# Patient Record
Sex: Female | Born: 1980 | Race: Black or African American | Hispanic: No | Marital: Single | Smoking: Former smoker
Health system: Southern US, Community
[De-identification: ages and names within clinical notes are randomized; demographics above are authoritative.]

## PROBLEM LIST (undated history)

## (undated) DIAGNOSIS — R011 Cardiac murmur, unspecified: Secondary | ICD-10-CM

## (undated) DIAGNOSIS — I10 Essential (primary) hypertension: Secondary | ICD-10-CM

## (undated) DIAGNOSIS — R7303 Prediabetes: Secondary | ICD-10-CM

## (undated) HISTORY — PX: WISDOM TOOTH EXTRACTION: SHX21

---

## 2010-04-15 ENCOUNTER — Emergency Department (HOSPITAL_BASED_OUTPATIENT_CLINIC_OR_DEPARTMENT_OTHER): Admission: EM | Admit: 2010-04-15 | Discharge: 2010-04-15 | Payer: Self-pay | Admitting: Emergency Medicine

## 2010-04-17 ENCOUNTER — Emergency Department (HOSPITAL_BASED_OUTPATIENT_CLINIC_OR_DEPARTMENT_OTHER): Admission: EM | Admit: 2010-04-17 | Discharge: 2010-04-17 | Payer: Self-pay | Admitting: Emergency Medicine

## 2011-12-24 ENCOUNTER — Ambulatory Visit: Payer: Self-pay | Admitting: Family Medicine

## 2014-04-11 ENCOUNTER — Other Ambulatory Visit: Payer: Self-pay | Admitting: Internal Medicine

## 2014-04-11 DIAGNOSIS — E041 Nontoxic single thyroid nodule: Secondary | ICD-10-CM

## 2014-04-20 ENCOUNTER — Ambulatory Visit
Admission: RE | Admit: 2014-04-20 | Discharge: 2014-04-20 | Disposition: A | Discharge status: Home / Self Care | Payer: 59 | Source: Ambulatory Visit | Attending: Internal Medicine | Admitting: Internal Medicine

## 2014-04-20 ENCOUNTER — Other Ambulatory Visit (HOSPITAL_COMMUNITY)
Admission: RE | Admit: 2014-04-20 | Discharge: 2014-04-20 | Disposition: A | Discharge status: Home / Self Care | Payer: 59 | Source: Ambulatory Visit | Attending: Interventional Radiology | Admitting: Interventional Radiology

## 2014-04-20 DIAGNOSIS — E041 Nontoxic single thyroid nodule: Secondary | ICD-10-CM

## 2014-11-01 ENCOUNTER — Other Ambulatory Visit: Payer: Self-pay | Admitting: Internal Medicine

## 2014-11-01 DIAGNOSIS — E042 Nontoxic multinodular goiter: Secondary | ICD-10-CM

## 2014-11-07 ENCOUNTER — Ambulatory Visit: Payer: 59

## 2015-07-19 IMAGING — US US THYROID BIOPSY
1 series · 9 of 9 positions shown · non-contrast
Comparison: 12/20/2013

CLINICAL DATA: Multinodular thyroid gland, goiter

EXAM:
ULTRASOUND GUIDED NEEDLE ASPIRATE BIOPSY OF THE THYROID GLAND

[Series 1: us thyroid biopsy · 9 acquisitions, 9 frames shown]
[im 1/9]
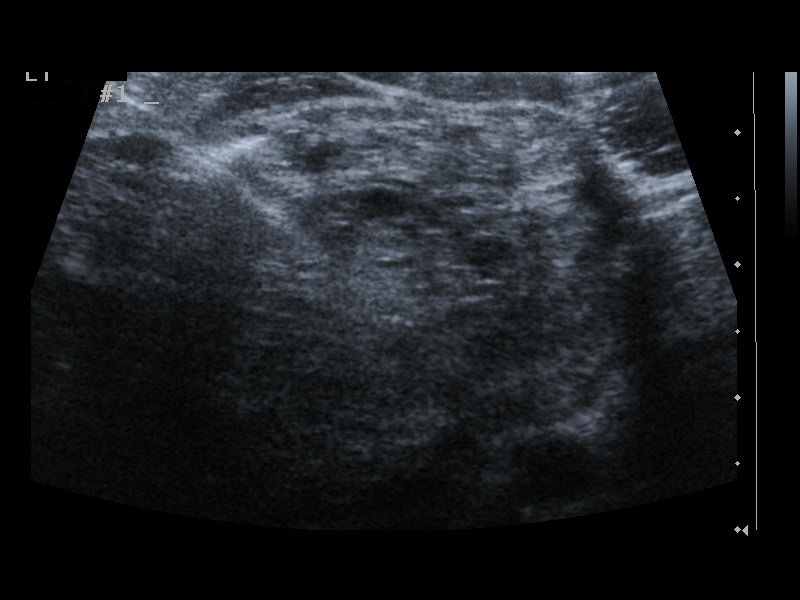
[im 2/9]
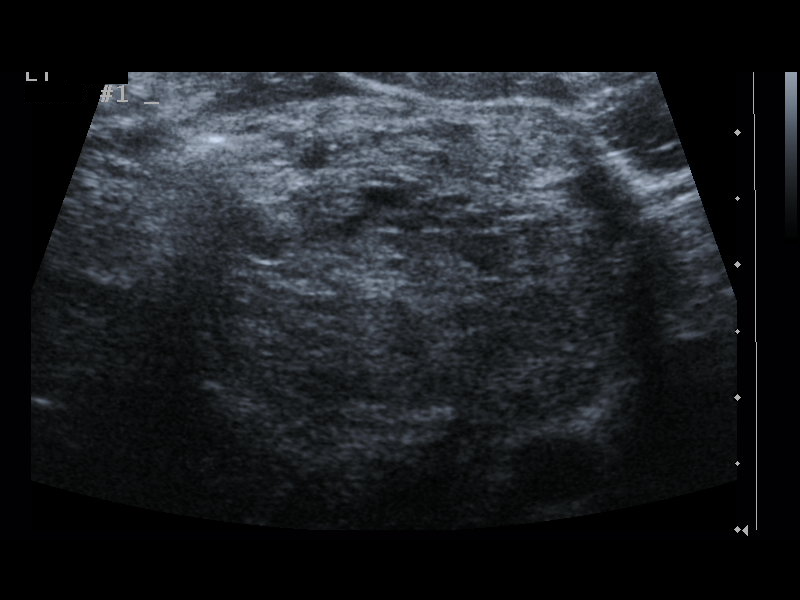
[im 3/9]
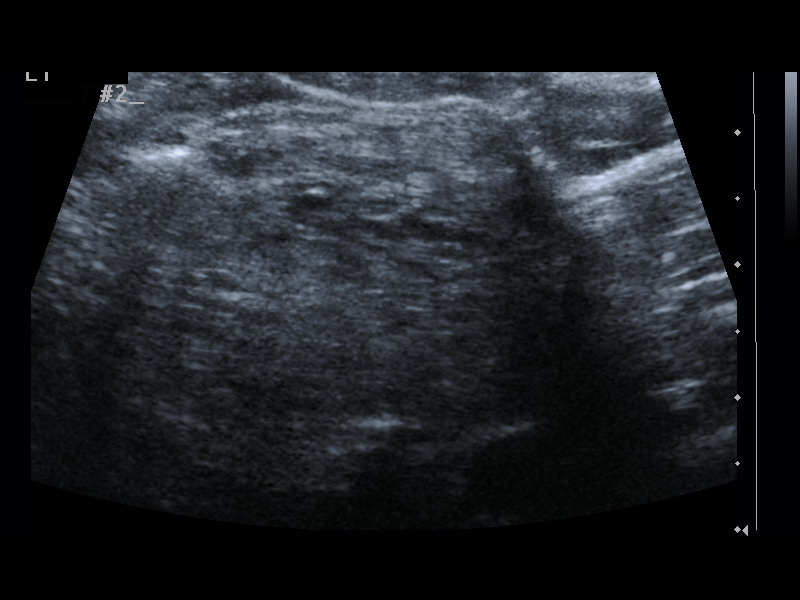
[im 4/9]
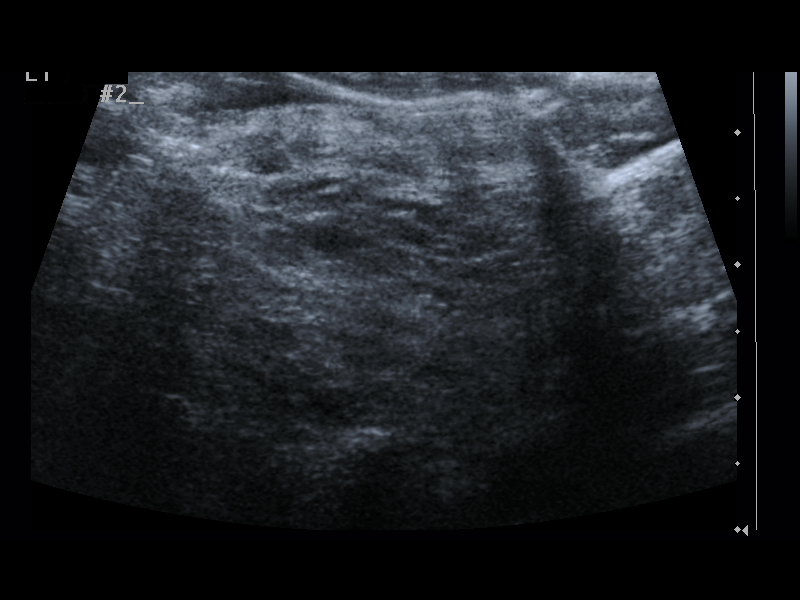
[im 5/9]
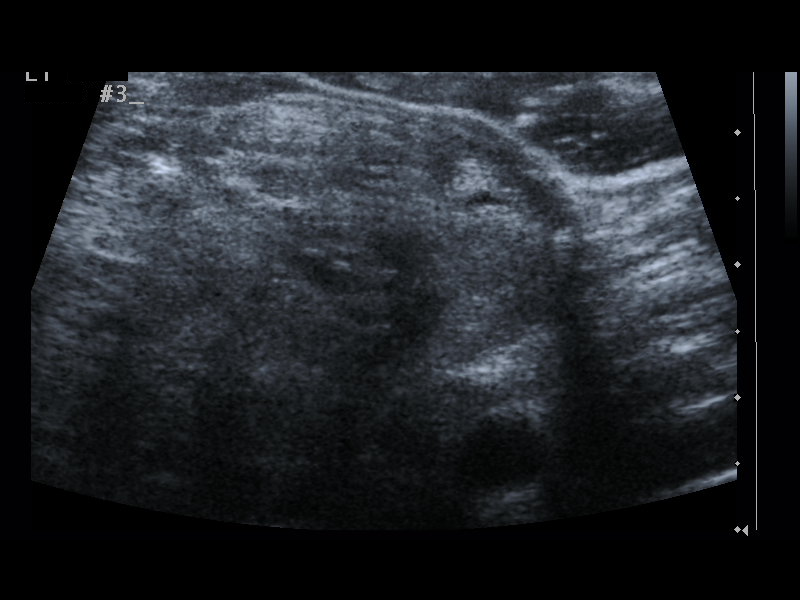
[im 6/9]
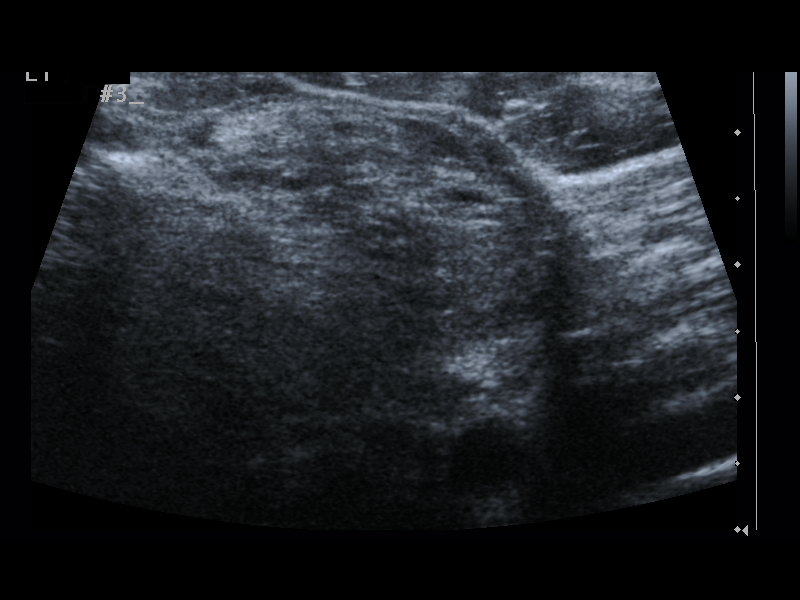
[im 7/9]
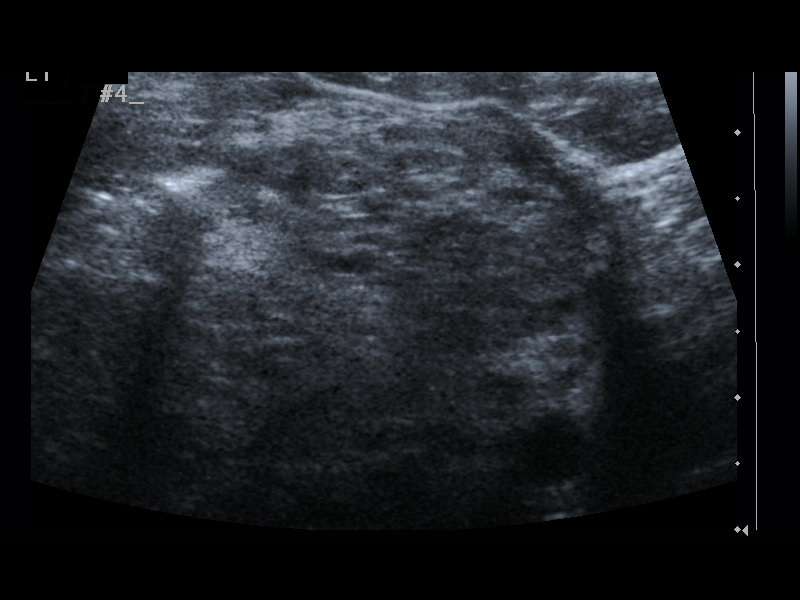
[im 8/9]
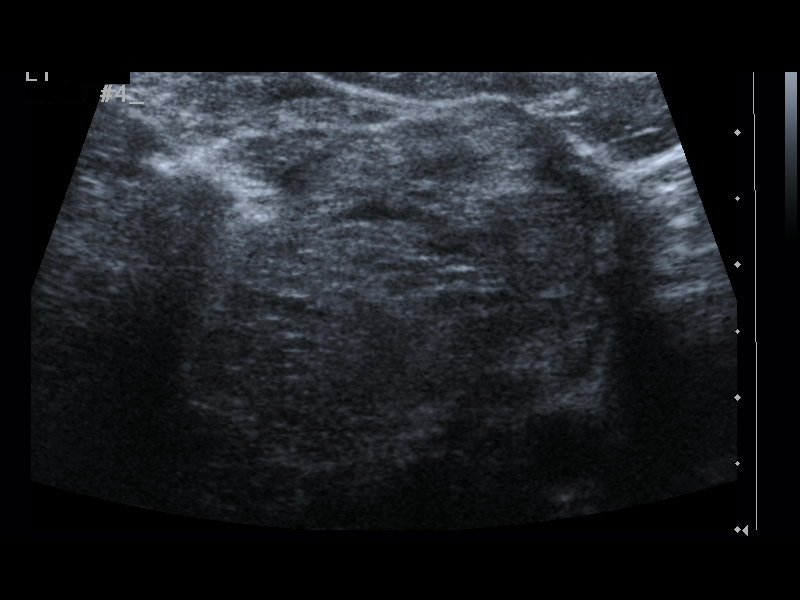
[im 9/9]
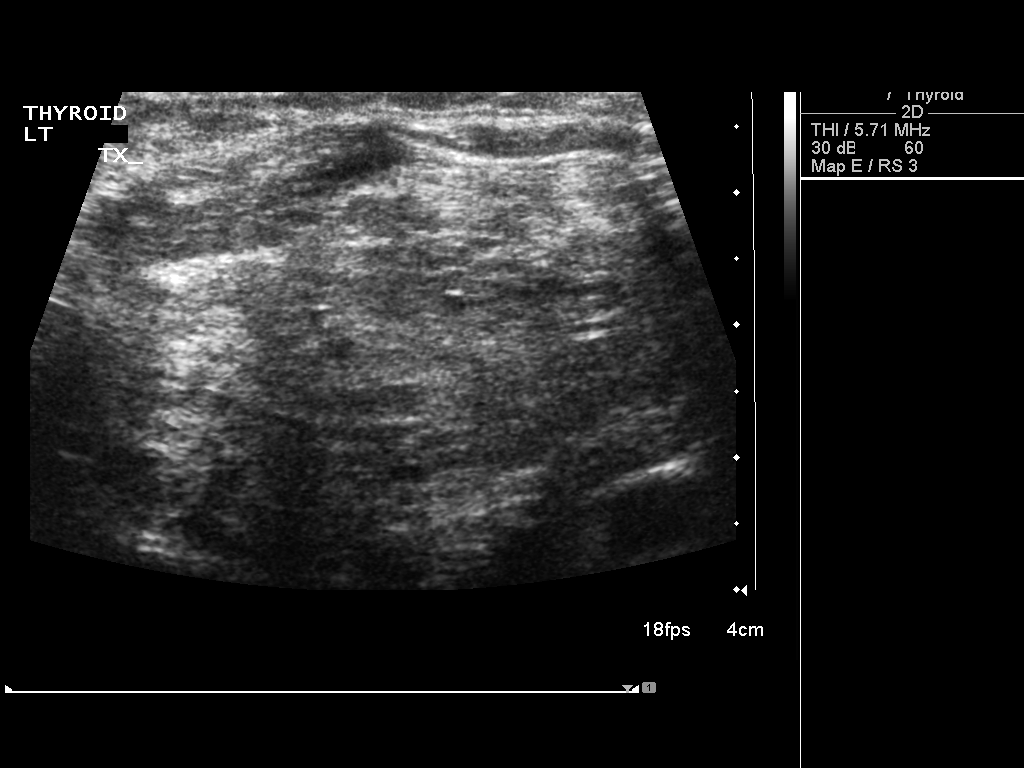

[9 of 9 positions shown; findings below may reference images not displayed]

PROCEDURE:
Thyroid biopsy was thoroughly discussed with the patient and
questions were answered. The benefits, risks, alternatives, and
complications were also discussed. The patient understands and
wishes to proceed with the procedure. Written consent was obtained.



Complications:  The median
FINDINGS: Imaging confirms needle placed in the dominant left inferior thyroid
mass
IMPRESSION: Ultrasound guided needle aspirate biopsy performed of the dominant
left inferior thyroid mass.

Other dominant right inferior and isthmus nodules are less apparent
on today's study. Cystic components have resolved. Recommend
six-month follow-up of the other thyroid nodules.

## 2015-10-15 DIAGNOSIS — E049 Nontoxic goiter, unspecified: Secondary | ICD-10-CM | POA: Insufficient documentation

## 2015-10-16 ENCOUNTER — Ambulatory Visit
Admission: RE | Admit: 2015-10-16 | Discharge: 2015-10-16 | Disposition: A | Discharge status: Home / Self Care | Payer: 59 | Source: Ambulatory Visit | Attending: Internal Medicine | Admitting: Internal Medicine

## 2015-10-16 DIAGNOSIS — E042 Nontoxic multinodular goiter: Secondary | ICD-10-CM

## 2015-10-22 DIAGNOSIS — E119 Type 2 diabetes mellitus without complications: Secondary | ICD-10-CM | POA: Insufficient documentation

## 2015-11-15 DIAGNOSIS — E042 Nontoxic multinodular goiter: Secondary | ICD-10-CM | POA: Diagnosis not present

## 2015-11-15 MED FILL — ACZONE 5% GEL: 5 | 30 days supply | Qty: 60 | Fill #0

## 2015-11-16 MED FILL — metFORMIN HCL 500 MG TABS: 500 | 30 days supply | Qty: 60 | Fill #1

## 2015-11-29 DIAGNOSIS — E119 Type 2 diabetes mellitus without complications: Secondary | ICD-10-CM | POA: Diagnosis not present

## 2015-12-12 DIAGNOSIS — L700 Acne vulgaris: Secondary | ICD-10-CM | POA: Diagnosis not present

## 2015-12-12 MED FILL — clonazePAM 0.5 MG TABS: 0.5 | 10 days supply | Qty: 20 | Fill #0

## 2015-12-12 MED FILL — DOXYCYCLINE HYCLATE 100 MG: 100 | 30 days supply | Qty: 30 | Fill #0

## 2015-12-18 MED FILL — ACZONE 7.5% GEL PUMP: 7.5 | 90 days supply | Qty: 90 | Fill #0

## 2015-12-18 MED FILL — TRETINOIN 0.05% CREAM: 0.05 | 30 days supply | Qty: 45 | Fill #0

## 2015-12-18 MED FILL — metFORMIN HCL 500 MG TABS: 500 | 30 days supply | Qty: 60 | Fill #2

## 2016-01-08 MED FILL — DOXYCYCLINE HYCLATE 100 MG: 100 | 30 days supply | Qty: 30 | Fill #1

## 2016-01-08 MED FILL — LISINOPRIL-HCTZ 10-12.5 MG: 10-12.5 | 90 days supply | Qty: 90 | Fill #1

## 2016-01-16 DIAGNOSIS — E119 Type 2 diabetes mellitus without complications: Secondary | ICD-10-CM | POA: Diagnosis not present

## 2016-01-16 MED FILL — metFORMIN HCL 500 MG TABS: 500 | 30 days supply | Qty: 60 | Fill #3

## 2016-01-22 DIAGNOSIS — E119 Type 2 diabetes mellitus without complications: Secondary | ICD-10-CM | POA: Diagnosis not present

## 2016-01-22 DIAGNOSIS — H5213 Myopia, bilateral: Secondary | ICD-10-CM | POA: Diagnosis not present

## 2016-01-22 DIAGNOSIS — Z7984 Long term (current) use of oral hypoglycemic drugs: Secondary | ICD-10-CM | POA: Diagnosis not present

## 2016-01-22 DIAGNOSIS — H52223 Regular astigmatism, bilateral: Secondary | ICD-10-CM | POA: Diagnosis not present

## 2016-02-02 DIAGNOSIS — E538 Deficiency of other specified B group vitamins: Secondary | ICD-10-CM | POA: Diagnosis not present

## 2016-02-02 DIAGNOSIS — E0500 Thyrotoxicosis with diffuse goiter without thyrotoxic crisis or storm: Secondary | ICD-10-CM | POA: Diagnosis not present

## 2016-02-02 DIAGNOSIS — R5382 Chronic fatigue, unspecified: Secondary | ICD-10-CM | POA: Diagnosis not present

## 2016-02-02 DIAGNOSIS — E559 Vitamin D deficiency, unspecified: Secondary | ICD-10-CM | POA: Diagnosis not present

## 2016-02-02 DIAGNOSIS — E039 Hypothyroidism, unspecified: Secondary | ICD-10-CM | POA: Diagnosis not present

## 2016-02-06 MED FILL — DOXYCYCLINE HYCLATE 100 MG: 100 | 30 days supply | Qty: 30 | Fill #2

## 2016-02-18 MED FILL — metFORMIN HCL 500 MG TABS: 500 | 30 days supply | Qty: 60 | Fill #4

## 2016-03-19 MED FILL — metFORMIN HCL 500 MG TABS: 500 | 30 days supply | Qty: 60 | Fill #5

## 2016-04-02 DIAGNOSIS — L700 Acne vulgaris: Secondary | ICD-10-CM | POA: Diagnosis not present

## 2016-04-02 DIAGNOSIS — L810 Postinflammatory hyperpigmentation: Secondary | ICD-10-CM | POA: Diagnosis not present

## 2016-04-02 MED FILL — LISINOPRIL-HCTZ 10-12.5 MG: 10-12.5 | 90 days supply | Qty: 90 | Fill #2

## 2016-04-02 MED FILL — DOXYCYCLINE HYCLATE 100 MG: 100 | 30 days supply | Qty: 30 | Fill #0

## 2016-04-17 DIAGNOSIS — E119 Type 2 diabetes mellitus without complications: Secondary | ICD-10-CM | POA: Diagnosis not present

## 2016-05-06 MED FILL — TRETINOIN 0.05% CREAM: 0.05 | 30 days supply | Qty: 45 | Fill #1

## 2016-05-06 MED FILL — ACZONE 7.5% GEL PUMP: 7.5 | 90 days supply | Qty: 90 | Fill #1

## 2016-07-03 DIAGNOSIS — E079 Disorder of thyroid, unspecified: Secondary | ICD-10-CM | POA: Diagnosis not present

## 2016-07-03 DIAGNOSIS — E785 Hyperlipidemia, unspecified: Secondary | ICD-10-CM | POA: Diagnosis not present

## 2016-07-03 DIAGNOSIS — I10 Essential (primary) hypertension: Secondary | ICD-10-CM | POA: Diagnosis not present

## 2016-07-03 DIAGNOSIS — Z8639 Personal history of other endocrine, nutritional and metabolic disease: Secondary | ICD-10-CM | POA: Diagnosis not present

## 2016-07-29 DIAGNOSIS — I10 Essential (primary) hypertension: Secondary | ICD-10-CM | POA: Diagnosis not present

## 2016-07-29 DIAGNOSIS — E119 Type 2 diabetes mellitus without complications: Secondary | ICD-10-CM | POA: Diagnosis not present

## 2016-07-29 DIAGNOSIS — L309 Dermatitis, unspecified: Secondary | ICD-10-CM | POA: Diagnosis not present

## 2016-09-27 MED FILL — ELIDEL 1% CREAM: 1 | 10 days supply | Qty: 30 | Fill #0

## 2016-10-25 ENCOUNTER — Other Ambulatory Visit: Payer: Self-pay | Admitting: Internal Medicine

## 2016-10-25 DIAGNOSIS — E042 Nontoxic multinodular goiter: Secondary | ICD-10-CM

## 2016-10-30 ENCOUNTER — Other Ambulatory Visit: Payer: 59

## 2016-11-12 ENCOUNTER — Ambulatory Visit
Admission: RE | Admit: 2016-11-12 | Discharge: 2016-11-12 | Disposition: A | Discharge status: Home / Self Care | Payer: 59 | Source: Ambulatory Visit | Attending: Internal Medicine | Admitting: Internal Medicine

## 2016-11-12 DIAGNOSIS — E042 Nontoxic multinodular goiter: Secondary | ICD-10-CM | POA: Diagnosis not present

## 2016-11-19 DIAGNOSIS — E042 Nontoxic multinodular goiter: Secondary | ICD-10-CM | POA: Diagnosis not present

## 2016-12-04 DIAGNOSIS — E042 Nontoxic multinodular goiter: Secondary | ICD-10-CM | POA: Diagnosis not present

## 2016-12-04 DIAGNOSIS — N920 Excessive and frequent menstruation with regular cycle: Secondary | ICD-10-CM | POA: Diagnosis not present

## 2016-12-04 DIAGNOSIS — E559 Vitamin D deficiency, unspecified: Secondary | ICD-10-CM | POA: Diagnosis not present

## 2016-12-17 MED FILL — ACZONE 7.5% GEL PUMP: 7.5 | 90 days supply | Qty: 90 | Fill #0

## 2017-01-13 IMAGING — US US SOFT TISSUE HEAD/NECK
1 series · 13 of 25 positions shown · non-contrast
Comparison: Thyroid ultrasound - 12/20/2013; left-sided thyroid
nodule ultrasound-guided fine-needle aspiration - 04/20/2014

CLINICAL DATA: History of multi nodular goiter.

EXAM:
THYROID ULTRASOUND
TECHNIQUE: Ultrasound examination of the thyroid gland and adjacent soft
tissues was performed.

[Series 1: us soft tissue head/neck · 0.07mm/px · 13 of 69 slices shown]
[im 1/69]
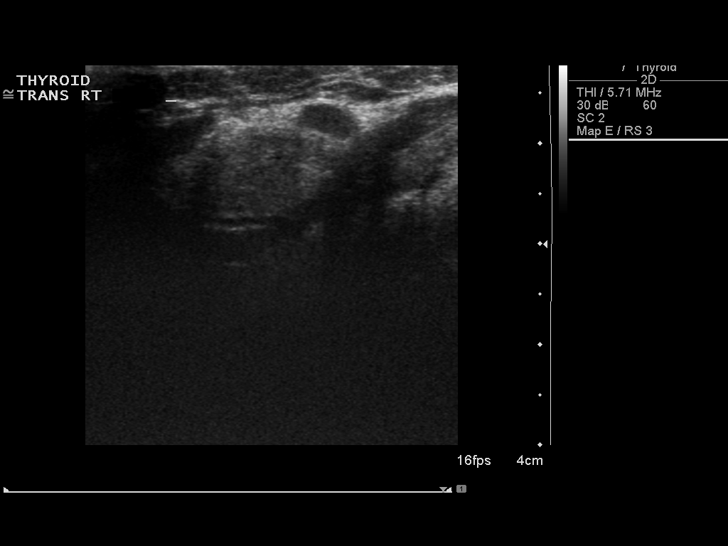
[im 6/69]
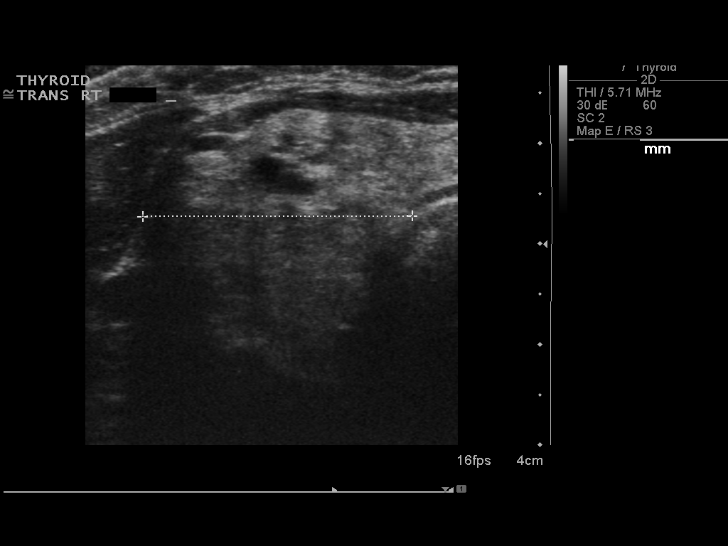
[im 12/69]
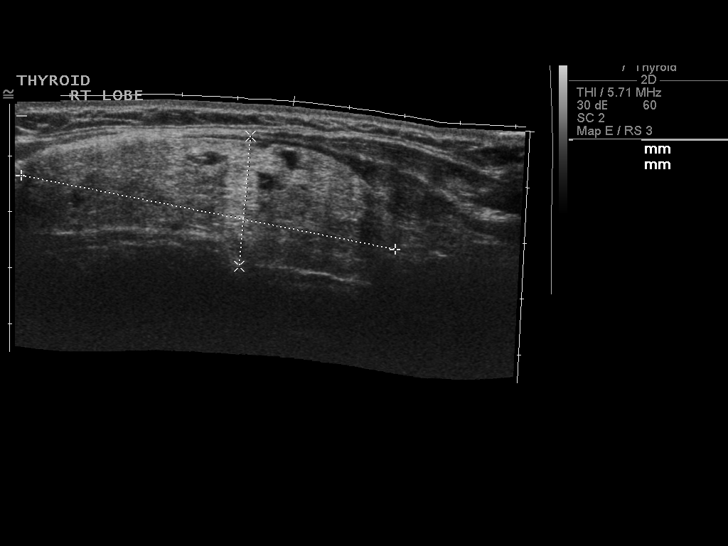
[im 18/69]
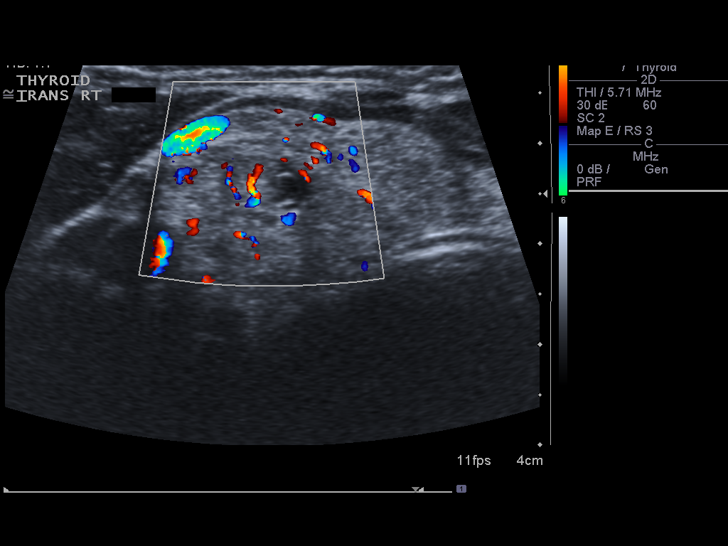
[im 23/69]
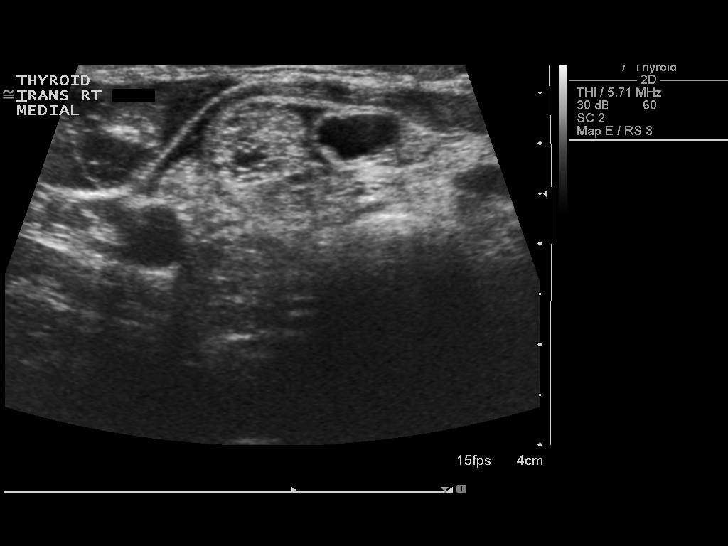
[im 29/69]
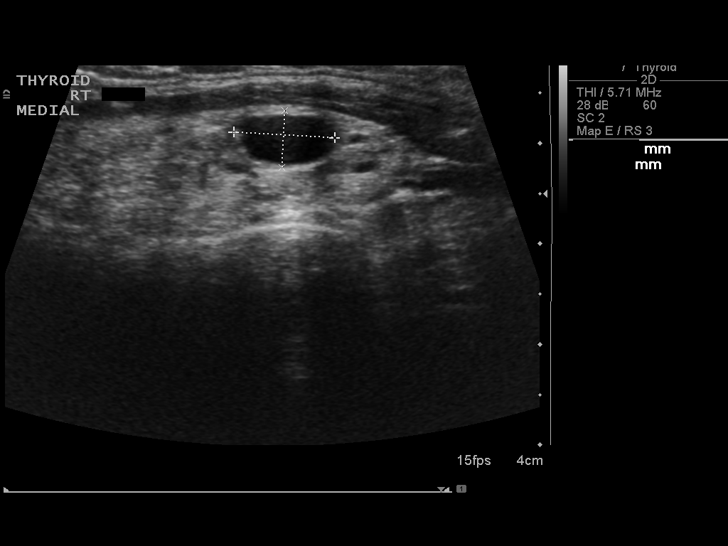
[im 35/69]
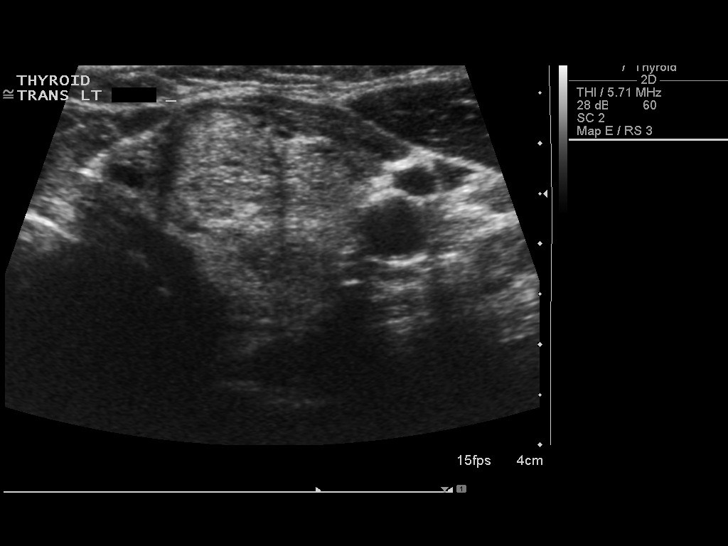
[im 40/69]
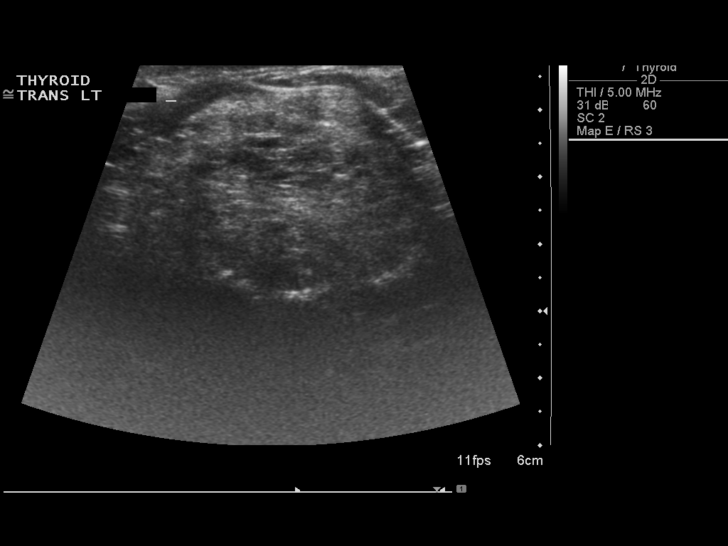
[im 46/69]
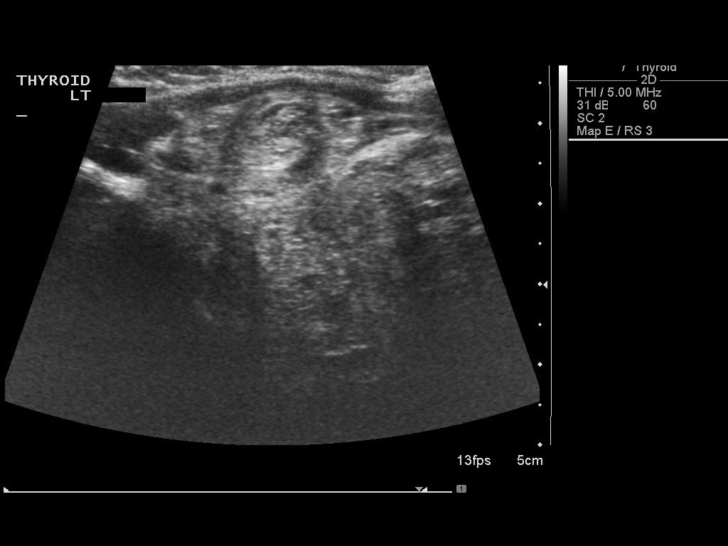
[im 52/69]
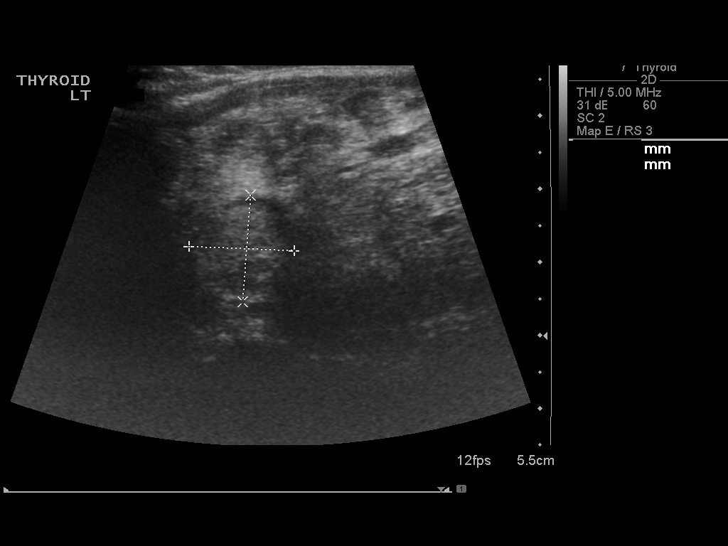
[im 57/69]
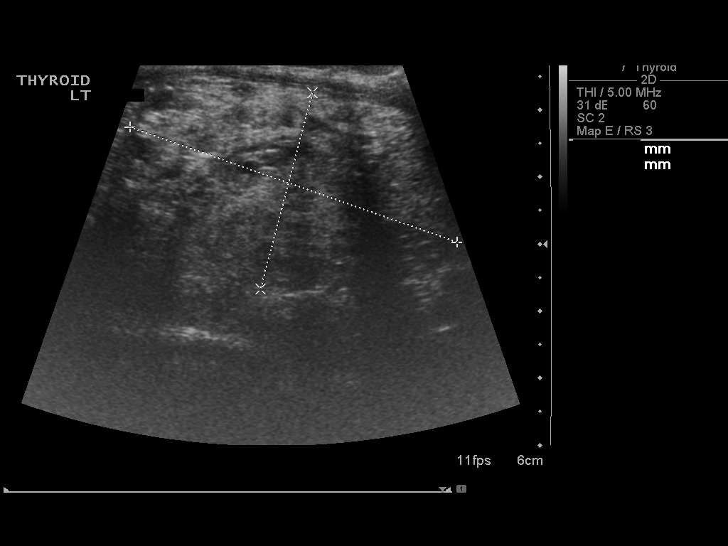
[im 63/69]
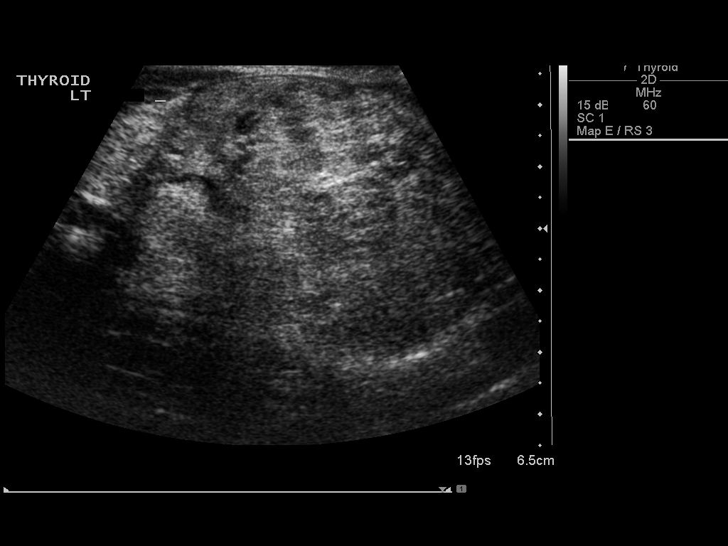
[im 69/69]
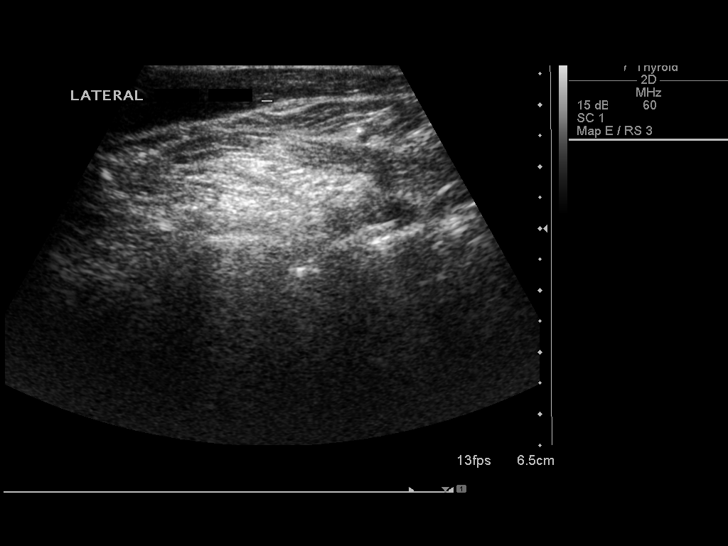

[13 of 25 positions shown; findings below may reference images not displayed]

FINDINGS: There is diffuse heterogeneity of thyroid parenchymal echotexture

Right thyroid lobe

Measurements: Borderline enlarged measuring 6.8 x 2.3 x 2.7 cm,
grossly unchanged, previously, 6.1 x 2.1 x 2.2 cm.

Right, mid - 1.0 x 0.6 x 0.8 cm - mixed echogenic, partially cystic,
partially solid - grossly unchanged, previously, 1.0 cm in maximal
diameter.

Right, inferior, anterior - 1.3 x 0.7 x 1.1 cm - mixed echogenic,
partially cystic, partially solid - similar in hind site,
previously, 1.1 x 0.8 cm

Right, inferior- 1.4 x 1.0 x 1.2 cm - mixed echogenic, partially
cystic, partially solid - decreased in size in the interval,
previously, 1.7 x 1.0 x 1.7 cm

Right, medial - 1.0 x 0.6 x 1.1 cm - anechoic, cystic - unchanged,
previously, 1.0 x 1.2 x 0.7 cm

Left thyroid lobe

Measurements: Enlarged measuring 8.0 x 3.5 x 4.5 cm, previously,
x 3.3 x 4.6 cm.

Left, superior - 1.9 x 1.3 x 1.1 cm - mixed echogenic, solid -
grossly unchanged in size, previously, 1.9 x 1.3 cm in diameter
though now with more solid component.

Left, inferior, medial - 1.4 x 1.5 x 1.5 cm - mixed echogenic, solid
- previously 1.2 x 1.0 cm with slight differences likely
attributable to scan plane projection.

Left, inferior - 5.2 x 3.0 x 4.3 cm - mixed echogenic, partially
cystic, predominately solid - grossly unchanged in hind site
measuring approximately 5.6 x 3.0 cm - this nodule was previously
biopsied.

The previously biopsied approximately 2.9 cm complex nodule within
the left lobe of the thyroid is not discretely measured on the
present examination rather is favored to be a portion of the
dominant nodule/mass replacing near the entirety the inferior aspect
the left lobe of the thyroid.

Isthmus

Thickness: Enlarged measuring 0.8 cm in diameter, previously,
cm..

No discrete nodule or mass identified within the thyroid isthmus.

Lymphadenopathy

None visualized.
IMPRESSION: 1. Overall similar findings suggestive of multi nodular goiter. No
definitive new or enlarging thyroid nodules.
2. The previously biopsied approximately 2.9 cm complex nodule
within the left lobe of the thyroid is not discretely measured on
the present examination, rather is favored to be a portion of the
dominant nodule/mass replacing near the entirety the inferior aspect
the left lobe of the thyroid which appears grossly unchanged in hind
site since the [DATE] examination. Regardless, correlation with
prior biopsy results is recommended.

## 2017-01-15 DIAGNOSIS — Z569 Unspecified problems related to employment: Secondary | ICD-10-CM | POA: Diagnosis not present

## 2017-01-15 DIAGNOSIS — Z600 Problems of adjustment to life-cycle transitions: Secondary | ICD-10-CM | POA: Diagnosis not present

## 2017-01-15 DIAGNOSIS — F4323 Adjustment disorder with mixed anxiety and depressed mood: Secondary | ICD-10-CM | POA: Diagnosis not present

## 2017-03-14 MED FILL — ACZONE 7.5% GEL PUMP: 7.5 | 90 days supply | Qty: 90 | Fill #1

## 2017-04-04 DIAGNOSIS — Z1329 Encounter for screening for other suspected endocrine disorder: Secondary | ICD-10-CM | POA: Diagnosis not present

## 2017-04-04 DIAGNOSIS — E119 Type 2 diabetes mellitus without complications: Secondary | ICD-10-CM | POA: Diagnosis not present

## 2017-04-04 DIAGNOSIS — E782 Mixed hyperlipidemia: Secondary | ICD-10-CM | POA: Diagnosis not present

## 2017-04-04 DIAGNOSIS — Z6834 Body mass index (BMI) 34.0-34.9, adult: Secondary | ICD-10-CM | POA: Diagnosis not present

## 2017-04-04 DIAGNOSIS — I10 Essential (primary) hypertension: Secondary | ICD-10-CM | POA: Diagnosis not present

## 2017-05-01 DIAGNOSIS — Z569 Unspecified problems related to employment: Secondary | ICD-10-CM | POA: Diagnosis not present

## 2017-05-01 DIAGNOSIS — F321 Major depressive disorder, single episode, moderate: Secondary | ICD-10-CM | POA: Diagnosis not present

## 2017-05-01 DIAGNOSIS — Z600 Problems of adjustment to life-cycle transitions: Secondary | ICD-10-CM | POA: Diagnosis not present

## 2017-05-28 DIAGNOSIS — E559 Vitamin D deficiency, unspecified: Secondary | ICD-10-CM | POA: Diagnosis not present

## 2017-05-28 DIAGNOSIS — E042 Nontoxic multinodular goiter: Secondary | ICD-10-CM | POA: Diagnosis not present

## 2017-05-28 DIAGNOSIS — R5383 Other fatigue: Secondary | ICD-10-CM | POA: Diagnosis not present

## 2017-06-03 DIAGNOSIS — F321 Major depressive disorder, single episode, moderate: Secondary | ICD-10-CM | POA: Diagnosis not present

## 2017-06-03 DIAGNOSIS — Z569 Unspecified problems related to employment: Secondary | ICD-10-CM | POA: Diagnosis not present

## 2017-06-03 DIAGNOSIS — Z600 Problems of adjustment to life-cycle transitions: Secondary | ICD-10-CM | POA: Diagnosis not present

## 2017-10-27 ENCOUNTER — Other Ambulatory Visit: Payer: Self-pay | Admitting: Internal Medicine

## 2017-10-27 DIAGNOSIS — E042 Nontoxic multinodular goiter: Secondary | ICD-10-CM

## 2017-10-31 ENCOUNTER — Other Ambulatory Visit: Payer: 59

## 2017-11-21 ENCOUNTER — Ambulatory Visit
Admission: RE | Admit: 2017-11-21 | Discharge: 2017-11-21 | Disposition: A | Discharge status: Home / Self Care | Payer: 59 | Source: Ambulatory Visit | Attending: Internal Medicine | Admitting: Internal Medicine

## 2017-11-21 DIAGNOSIS — E042 Nontoxic multinodular goiter: Secondary | ICD-10-CM

## 2017-11-25 DIAGNOSIS — E042 Nontoxic multinodular goiter: Secondary | ICD-10-CM | POA: Diagnosis not present

## 2017-11-25 DIAGNOSIS — R7989 Other specified abnormal findings of blood chemistry: Secondary | ICD-10-CM | POA: Diagnosis not present

## 2017-11-26 DIAGNOSIS — E042 Nontoxic multinodular goiter: Secondary | ICD-10-CM | POA: Diagnosis not present

## 2017-11-26 DIAGNOSIS — R5383 Other fatigue: Secondary | ICD-10-CM | POA: Diagnosis not present

## 2017-11-26 DIAGNOSIS — E559 Vitamin D deficiency, unspecified: Secondary | ICD-10-CM | POA: Diagnosis not present

## 2017-11-26 MED FILL — NEXPLANON 68 MG IMPLANT: 68 | 90 days supply | Qty: 1 | Fill #0

## 2017-12-01 MED FILL — ACZONE 7.5% GEL PUMP: 7.5 | 90 days supply | Qty: 90 | Fill #2

## 2017-12-03 DIAGNOSIS — Z3046 Encounter for surveillance of implantable subdermal contraceptive: Secondary | ICD-10-CM | POA: Diagnosis not present

## 2017-12-12 MED FILL — DOXYCYCLINE HYCLATE 100 MG: 100 | 30 days supply | Qty: 30 | Fill #0

## 2018-01-16 DIAGNOSIS — L700 Acne vulgaris: Secondary | ICD-10-CM | POA: Diagnosis not present

## 2018-01-16 MED FILL — DOXYCYCLINE HYCLATE 100 MG: 100 | 30 days supply | Qty: 30 | Fill #0

## 2018-01-16 MED FILL — CLINDAMYCIN PHOS-BENZOYL PE: 1-5 | 30 days supply | Qty: 50 | Fill #0

## 2018-01-20 MED FILL — TRETINOIN 0.1% CREAM: 0.1 | 30 days supply | Qty: 45 | Fill #0

## 2018-04-03 DIAGNOSIS — H40013 Open angle with borderline findings, low risk, bilateral: Secondary | ICD-10-CM | POA: Diagnosis not present

## 2018-04-03 DIAGNOSIS — H52223 Regular astigmatism, bilateral: Secondary | ICD-10-CM | POA: Diagnosis not present

## 2018-04-03 DIAGNOSIS — H5213 Myopia, bilateral: Secondary | ICD-10-CM | POA: Diagnosis not present

## 2018-04-03 DIAGNOSIS — Z83511 Family history of glaucoma: Secondary | ICD-10-CM | POA: Diagnosis not present

## 2018-05-22 IMAGING — US US THYROID
1 series · 12 of 25 positions shown · non-contrast
Comparison: 11/12/2016;

CLINICAL DATA: Goiter. History of left-sided thyroid nodule
fine-needle aspiration from 04/20/2014

EXAM:
THYROID ULTRASOUND
TECHNIQUE: Ultrasound examination of the thyroid gland and adjacent soft
tissues was performed.

[Series 1: us thyroid · 0.06mm/px · 12 of 58 slices shown]
[im 3/58]
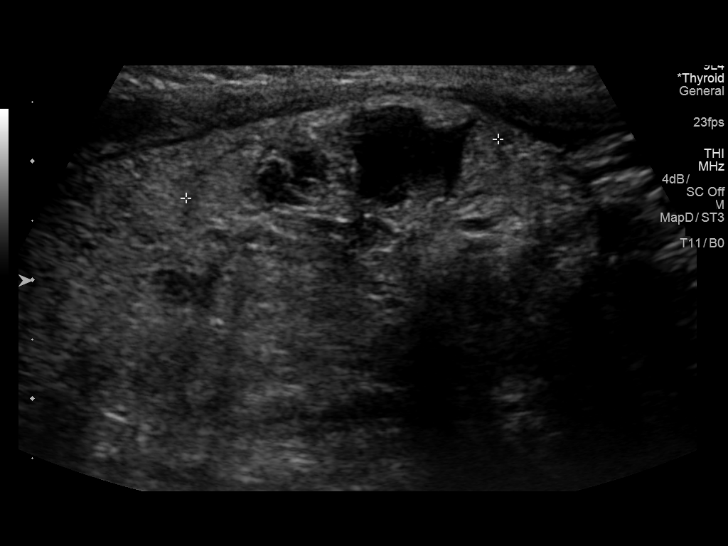
[im 8/58]
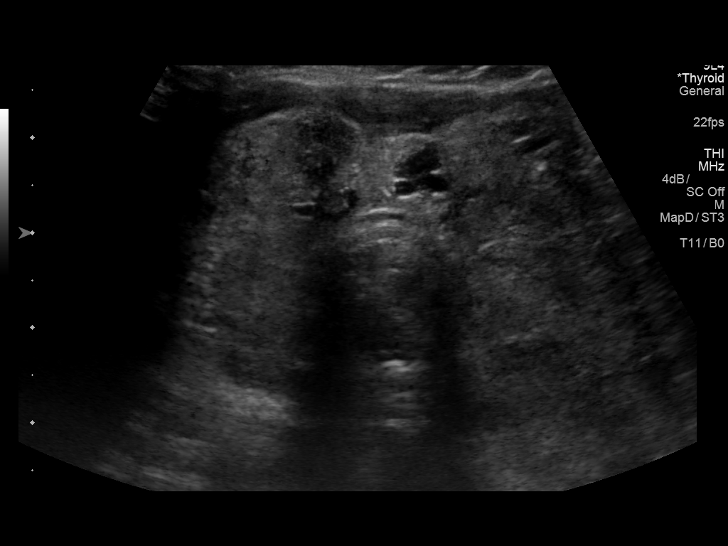
[im 12/58]
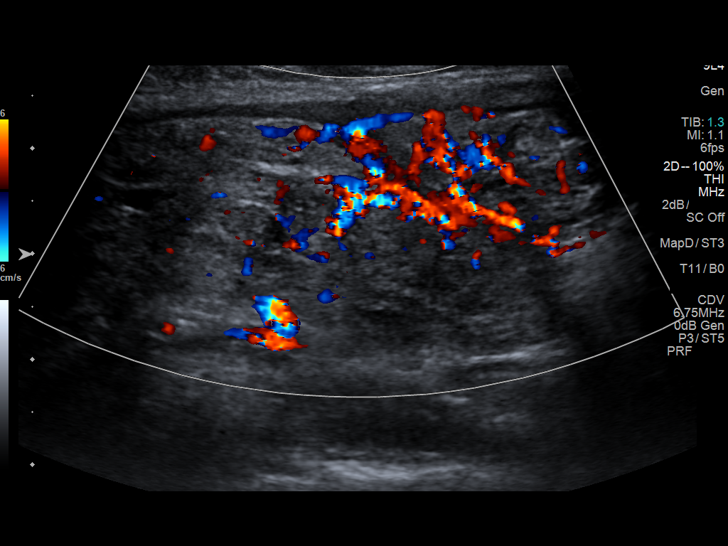
[im 17/58]
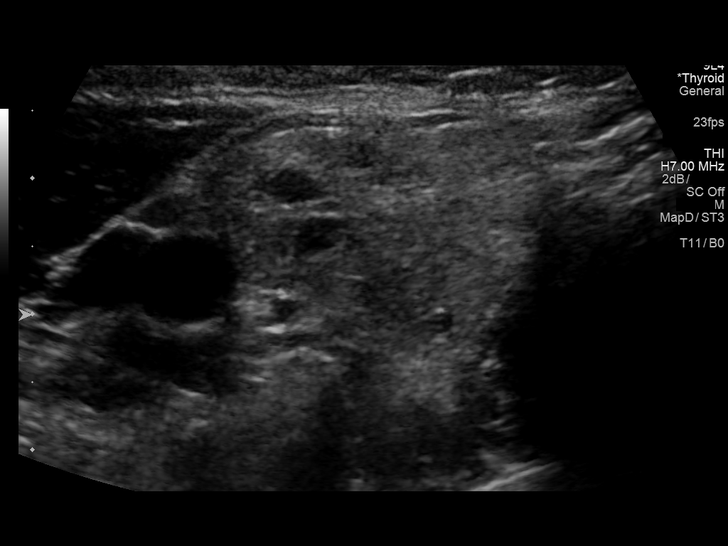
[im 22/58]
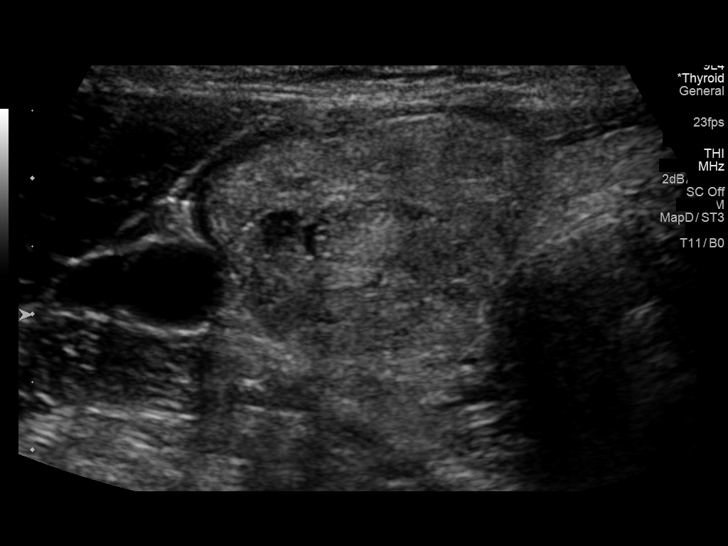
[im 27/58]
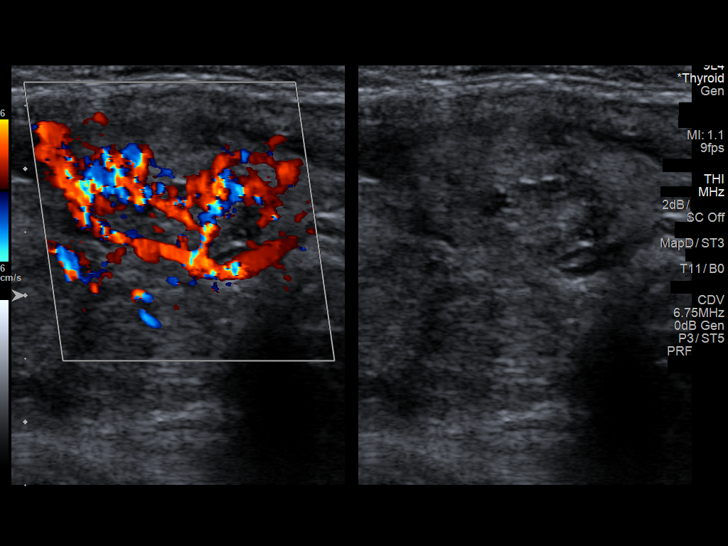
[im 31/58]
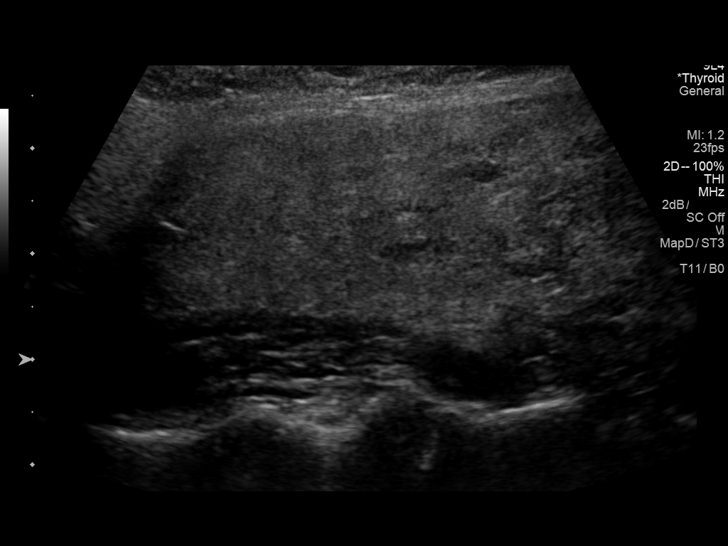
[im 36/58]
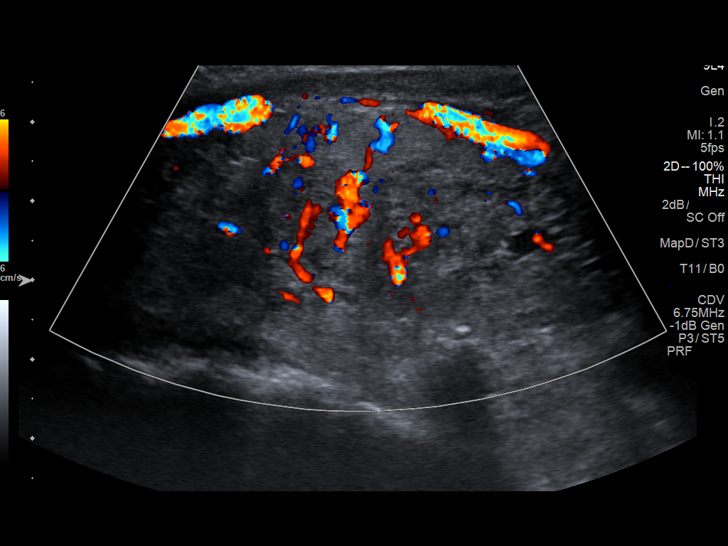
[im 41/58]
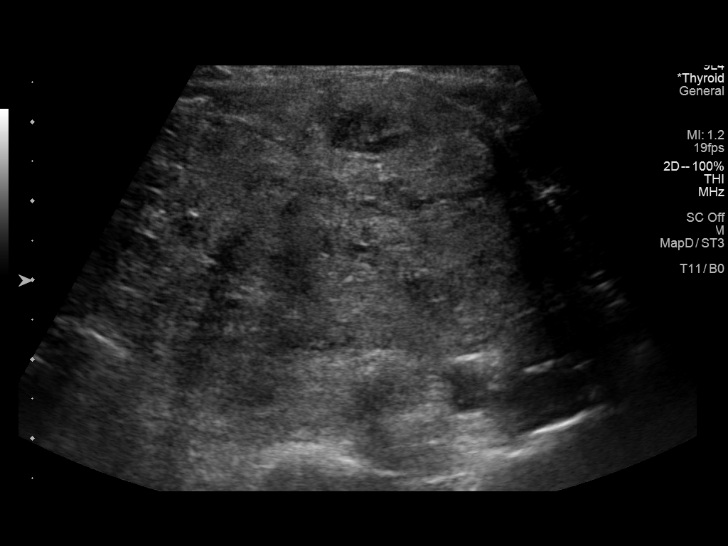
[im 46/58]
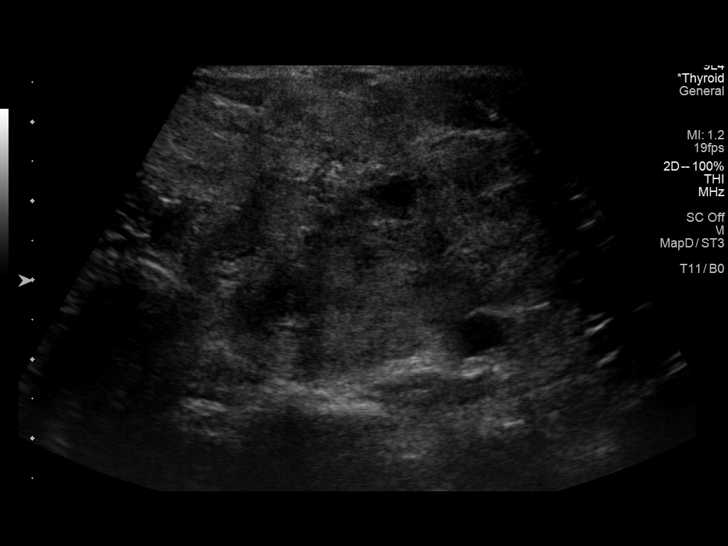
[im 50/58]
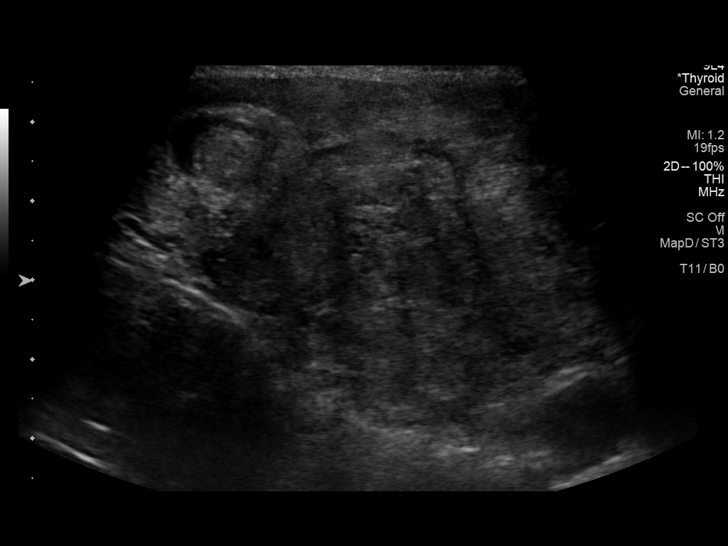
[im 55/58]
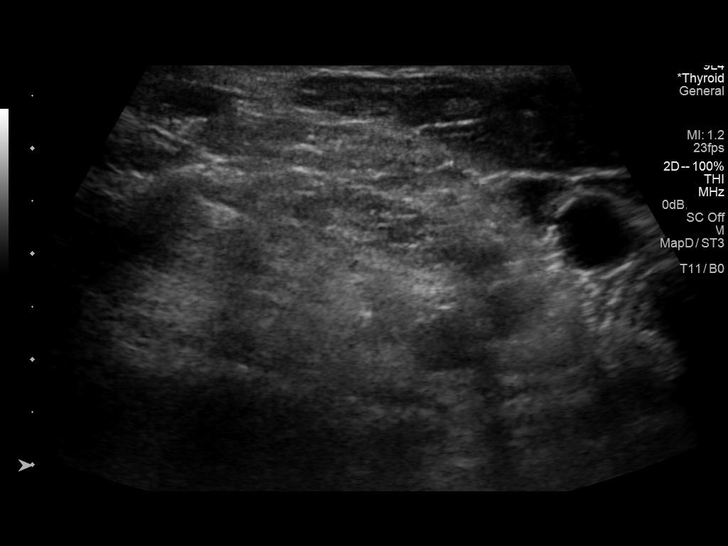

[12 of 25 positions shown; findings below may reference images not displayed]

10/16/2015; ultrasound-guided left-sided
thyroid nodule fine-needle aspiration - 04/20/2014
FINDINGS: Parenchymal Echotexture: Moderately heterogenous

Isthmus: Normal in size measuring 0.7 cm in diameter, unchanged

Right lobe: Enlarged measuring 7.1 x 2.9 x 3.0 cm, unchanged from
previously, 9.3 x 2.2 x 2.6 cm

Left lobe: Enlarged measuring 8.8 x 4.5 x 5.3 cm, unchanged from
previously, 9.0 x 4.3 x 5.5 cm

_________________________________________________________

Estimated total number of nodules >/= 1 cm: 4

Number of spongiform nodules >/=  2 cm not described below (TR1): 0

Number of mixed cystic and solid nodules >/= 1.5 cm not described
below (TR2): 0

_________________________________________________________

The previously biopsied approximately 5.2 x 4.5 x 3.4 cm nodule/mass
replacing the left lobe of the thyroid is grossly unchanged compared
to the [DATE] examination, previously, 5.6 x 4.3 x 4.9 cm.
Correlation with prior biopsy results is recommended. Assuming
benign pathologic diagnosis, repeat sampling and will continue to
get follow-up is not recommended.

_________________________________________________________

The approximately 2.2 x 2.0 x 1.4 cm partially cystic, partially
solid nodule within the right side of the thyroid isthmus is
unchanged compared to the [DATE] examination, previously, 2.0 x
x 1.5 cm, and again does not meet imaging criteria to recommend
percutaneous sampling or continue dedicated follow-up.

The approximately 1.8 x 1.7 x 1.3 cm nodule/pseudo nodule within the
inferior pole of the right lobe of the thyroid is unchanged compared
to the [DATE] examination, previously, 1.9 x 1.6 x 1.2 cm and again
does not meet imaging criteria to recommend percutaneous sampling to
continue dedicated follow-up.

The approximately 1.4 x 1.3 x 0.8 cm nodule / pseudo nodule within
the inferior pole of the right lobe of the thyroid was not
definitely seen on the prior examination though does not meet
imaging criteria to recommend percutaneous sampling were continued
indicated follow-up.
IMPRESSION: 1. Similar findings of multinodular goiter.
2. The previously biopsied approximately 5.2 cm nodule/mass
replacing the left lobe of the thyroid is grossly unchanged compared
to the [DATE] examination, previously, 5.6 cm. Correlation with
prior biopsy results is recommended. Assuming a benign pathologic
diagnosis, repeat sampling and/or continue dedicated follow-up is
not recommended.
3. None of the remaining thyroid nodules meet imaging criteria to
recommend percutaneous sampling to continue dedicated follow-up.

The above is in keeping with the ACR TI-RADS recommendations - [HOSPITAL] 5435;[DATE].

## 2018-07-31 MED FILL — CLINDAMYCIN PHOS-BENZOYL PE: 1-5 | 30 days supply | Qty: 50 | Fill #1

## 2018-07-31 MED FILL — ACZONE 7.5% GEL PUMP: 7.5 | 90 days supply | Qty: 90 | Fill #0

## 2018-08-13 MED FILL — CLINDAMYCIN PHOS-BENZOYL PE: 1-5 | 30 days supply | Qty: 50 | Fill #1

## 2018-09-04 MED FILL — ACZONE 7.5% GEL PUMP: 7.5 | 90 days supply | Qty: 90 | Fill #0

## 2018-12-11 DIAGNOSIS — R5383 Other fatigue: Secondary | ICD-10-CM | POA: Diagnosis not present

## 2018-12-11 DIAGNOSIS — E559 Vitamin D deficiency, unspecified: Secondary | ICD-10-CM | POA: Diagnosis not present

## 2018-12-11 DIAGNOSIS — E042 Nontoxic multinodular goiter: Secondary | ICD-10-CM | POA: Diagnosis not present

## 2018-12-23 ENCOUNTER — Ambulatory Visit: Payer: 59 | Admitting: Family Medicine

## 2019-01-08 ENCOUNTER — Other Ambulatory Visit: Payer: Self-pay | Admitting: Internal Medicine

## 2019-01-08 DIAGNOSIS — E042 Nontoxic multinodular goiter: Secondary | ICD-10-CM

## 2019-01-26 MED FILL — CLINDAMYCIN PHOS-BENZOYL PE: 1-5 | 30 days supply | Qty: 50 | Fill #0

## 2019-01-27 MED FILL — ACZONE 7.5% GEL PUMP: 7.5 | 90 days supply | Qty: 90 | Fill #1

## 2019-01-29 ENCOUNTER — Other Ambulatory Visit: Payer: 59

## 2019-02-04 DIAGNOSIS — Z600 Problems of adjustment to life-cycle transitions: Secondary | ICD-10-CM | POA: Diagnosis not present

## 2019-02-04 DIAGNOSIS — F341 Dysthymic disorder: Secondary | ICD-10-CM | POA: Diagnosis not present

## 2019-02-04 DIAGNOSIS — Z569 Unspecified problems related to employment: Secondary | ICD-10-CM | POA: Diagnosis not present

## 2019-02-05 ENCOUNTER — Other Ambulatory Visit: Payer: 59

## 2019-02-23 DIAGNOSIS — Z600 Problems of adjustment to life-cycle transitions: Secondary | ICD-10-CM | POA: Diagnosis not present

## 2019-02-23 DIAGNOSIS — F341 Dysthymic disorder: Secondary | ICD-10-CM | POA: Diagnosis not present

## 2019-02-23 DIAGNOSIS — Z569 Unspecified problems related to employment: Secondary | ICD-10-CM | POA: Diagnosis not present

## 2019-03-05 ENCOUNTER — Other Ambulatory Visit: Payer: 59

## 2019-03-19 DIAGNOSIS — E042 Nontoxic multinodular goiter: Secondary | ICD-10-CM | POA: Diagnosis not present

## 2019-03-19 DIAGNOSIS — R7989 Other specified abnormal findings of blood chemistry: Secondary | ICD-10-CM | POA: Diagnosis not present

## 2019-04-02 ENCOUNTER — Other Ambulatory Visit: Payer: 59

## 2019-05-04 ENCOUNTER — Encounter: Payer: Self-pay | Admitting: Family Medicine

## 2019-05-04 ENCOUNTER — Ambulatory Visit (INDEPENDENT_AMBULATORY_CARE_PROVIDER_SITE_OTHER): Payer: 59 | Admitting: Family Medicine

## 2019-05-04 DIAGNOSIS — E049 Nontoxic goiter, unspecified: Secondary | ICD-10-CM

## 2019-05-04 DIAGNOSIS — E119 Type 2 diabetes mellitus without complications: Secondary | ICD-10-CM | POA: Diagnosis not present

## 2019-05-04 MED ORDER — CYCLOBENZAPRINE HCL 10 MG PO TABS: 10.0000 mg | ORAL_TABLET | Freq: Three times a day (TID) | ORAL | 0 refills | Status: DC | PRN

## 2019-05-04 MED FILL — CYCLOBENZAPRINE HCL 10 MG T: 10 | 10 days supply | Qty: 30 | Fill #0

## 2019-05-04 NOTE — Assessment & Plan Note (Signed)
-  Low back pain, likely due to strain/spasm.  Start flexeril up to tid as needed, warned about sedation.   -Continue NSAID and recommend icing to area.
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
## 2019-05-04 NOTE — Assessment & Plan Note (Signed)
-  Last a1c of 5.8%, well controlled at this time.  She will continue with healthy lifestyle.

## 2019-05-04 NOTE — Assessment & Plan Note (Signed)
-  Following with Dr. Buddy Duty, stable at this time.  -Also taking OTC thyroid supplement

## 2019-05-11 DIAGNOSIS — F900 Attention-deficit hyperactivity disorder, predominantly inattentive type: Secondary | ICD-10-CM | POA: Diagnosis not present

## 2019-05-17 DIAGNOSIS — F900 Attention-deficit hyperactivity disorder, predominantly inattentive type: Secondary | ICD-10-CM | POA: Diagnosis not present

## 2019-05-18 DIAGNOSIS — F900 Attention-deficit hyperactivity disorder, predominantly inattentive type: Secondary | ICD-10-CM | POA: Diagnosis not present

## 2019-05-25 DIAGNOSIS — F900 Attention-deficit hyperactivity disorder, predominantly inattentive type: Secondary | ICD-10-CM | POA: Diagnosis not present

## 2019-05-25 MED FILL — AMPHETAMINE-DEXTROAMPHETAMI: 10 | 30 days supply | Qty: 60 | Fill #0

## 2019-06-09 ENCOUNTER — Ambulatory Visit
Admission: RE | Admit: 2019-06-09 | Discharge: 2019-06-09 | Disposition: A | Discharge status: Home / Self Care | Payer: 59 | Source: Ambulatory Visit | Attending: Internal Medicine | Admitting: Internal Medicine

## 2019-06-09 DIAGNOSIS — E042 Nontoxic multinodular goiter: Secondary | ICD-10-CM

## 2019-06-09 DIAGNOSIS — E041 Nontoxic single thyroid nodule: Secondary | ICD-10-CM | POA: Diagnosis not present

## 2019-06-22 MED FILL — AMPHETAMINE-DEXTROAMPHETAMI: 10 | 30 days supply | Qty: 60 | Fill #0

## 2019-06-24 DIAGNOSIS — F900 Attention-deficit hyperactivity disorder, predominantly inattentive type: Secondary | ICD-10-CM | POA: Diagnosis not present

## 2019-08-02 MED FILL — AMPHETAMINE-DEXTROAMPHETAMI: 10 | 30 days supply | Qty: 60 | Fill #0

## 2019-09-21 MED FILL — CLINDAMYCIN PHOS-BENZOYL PE: 1-5 | 30 days supply | Qty: 50 | Fill #1

## 2019-09-21 MED FILL — AMPHETAMINE-DEXTROAMPHETAMI: 10 | 30 days supply | Qty: 60 | Fill #0

## 2019-10-13 DIAGNOSIS — F900 Attention-deficit hyperactivity disorder, predominantly inattentive type: Secondary | ICD-10-CM | POA: Diagnosis not present

## 2019-10-13 MED FILL — TEMAZEPAM 7.5 MG CAPSULE: 7.5 | 30 days supply | Qty: 30 | Fill #0

## 2019-10-19 MED FILL — AMPHETAMINE-DEXTROAMPHETAMI: 10 | 30 days supply | Qty: 60 | Fill #0

## 2019-11-18 MED FILL — AMPHETAMINE-DEXTROAMPHETAMI: 10 | 30 days supply | Qty: 60 | Fill #0

## 2019-11-19 MED FILL — DAPSONE 7.5 % GEL: 7.5 | 90 days supply | Qty: 90 | Fill #0

## 2020-01-11 DIAGNOSIS — F900 Attention-deficit hyperactivity disorder, predominantly inattentive type: Secondary | ICD-10-CM | POA: Diagnosis not present

## 2020-01-11 MED FILL — AMPHETAMINE-DEXTROAMPHETAMI: 20 | 30 days supply | Qty: 45 | Fill #0

## 2020-03-24 DIAGNOSIS — E042 Nontoxic multinodular goiter: Secondary | ICD-10-CM | POA: Diagnosis not present

## 2020-03-24 DIAGNOSIS — R946 Abnormal results of thyroid function studies: Secondary | ICD-10-CM | POA: Diagnosis not present

## 2020-03-28 MED FILL — AMPHETAMINE-DEXTROAMPHETAMI: 20 | 30 days supply | Qty: 45 | Fill #0

## 2020-04-05 ENCOUNTER — Other Ambulatory Visit (HOSPITAL_COMMUNITY): Payer: Self-pay | Admitting: Dermatology

## 2020-04-05 MED FILL — CLINDAMYCIN PHOS-BENZOYL PE: 1-5 | 30 days supply | Qty: 50 | Fill #0

## 2020-05-08 MED FILL — CLINDAMYCIN PHOS-BENZOYL PE: 1-5 | 30 days supply | Qty: 50 | Fill #0

## 2020-06-14 MED FILL — AMPHETAMINE-DEXTROAMPHETAMI: 20 | 30 days supply | Qty: 45 | Fill #0

## 2020-07-07 DIAGNOSIS — F900 Attention-deficit hyperactivity disorder, predominantly inattentive type: Secondary | ICD-10-CM | POA: Diagnosis not present

## 2020-07-12 DIAGNOSIS — Z131 Encounter for screening for diabetes mellitus: Secondary | ICD-10-CM | POA: Diagnosis not present

## 2020-07-12 DIAGNOSIS — E042 Nontoxic multinodular goiter: Secondary | ICD-10-CM | POA: Diagnosis not present

## 2020-07-12 DIAGNOSIS — E559 Vitamin D deficiency, unspecified: Secondary | ICD-10-CM | POA: Diagnosis not present

## 2020-07-12 DIAGNOSIS — Z1321 Encounter for screening for nutritional disorder: Secondary | ICD-10-CM | POA: Diagnosis not present

## 2020-07-12 DIAGNOSIS — E538 Deficiency of other specified B group vitamins: Secondary | ICD-10-CM | POA: Diagnosis not present

## 2020-07-13 DIAGNOSIS — E0520 Thyrotoxicosis with toxic multinodular goiter without thyrotoxic crisis or storm: Secondary | ICD-10-CM | POA: Diagnosis not present

## 2020-09-06 IMAGING — US US THYROID
1 series · 12 of 25 positions shown · non-contrast
Comparison: 11/21/2017;

CLINICAL DATA: Prior ultrasound follow-up.

EXAM:
THYROID ULTRASOUND
TECHNIQUE: Ultrasound examination of the thyroid gland and adjacent soft
tissues was performed.

[Series 1: us thyroid · 0.07mm/px · 64 acquisitions, 12 frames shown]
[im 3/64]
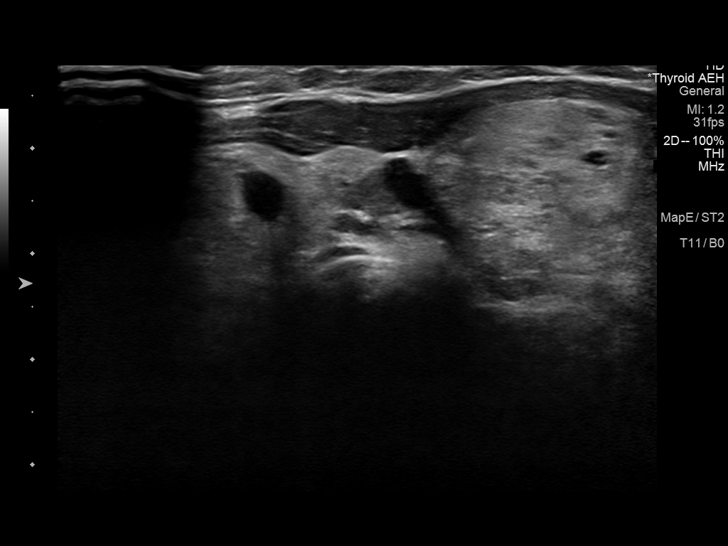
[im 8/64]
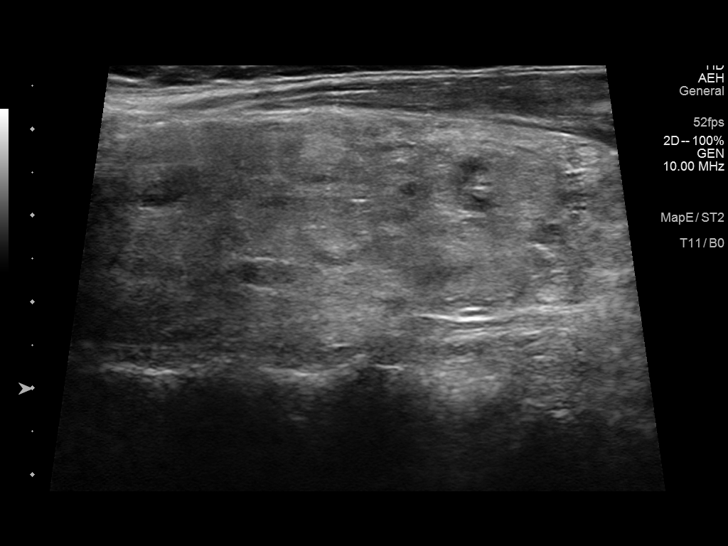
[im 14/64]
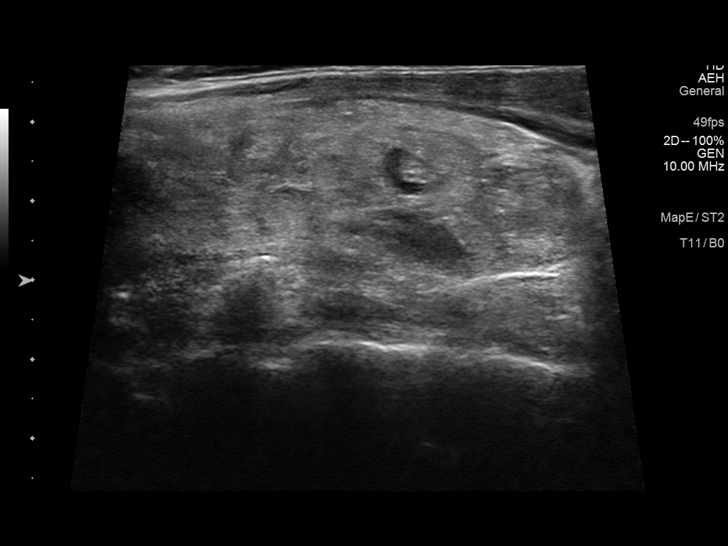
[im 19/64]
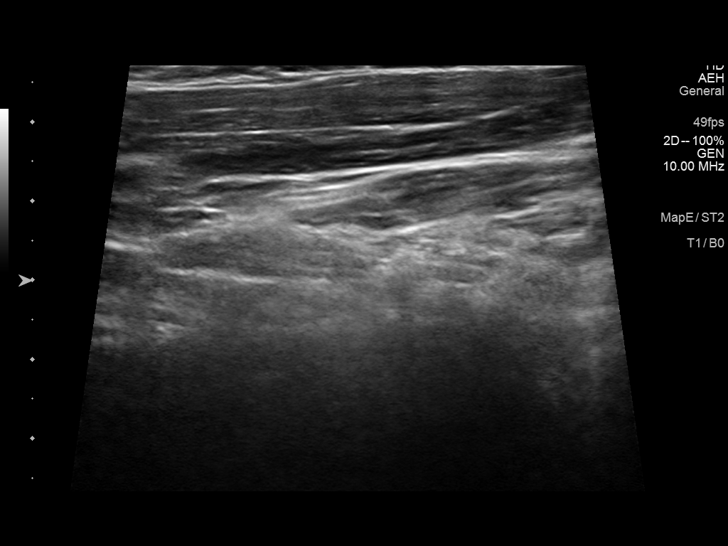
[im 24/64]
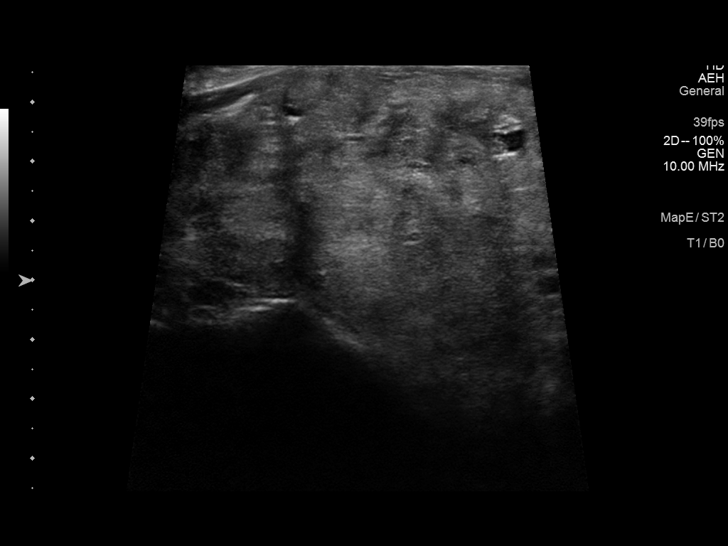
[im 29/64]
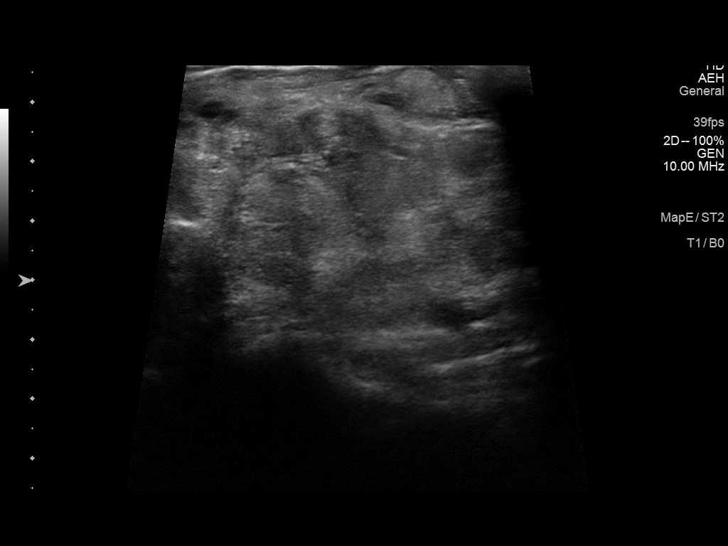
[im 35/64]
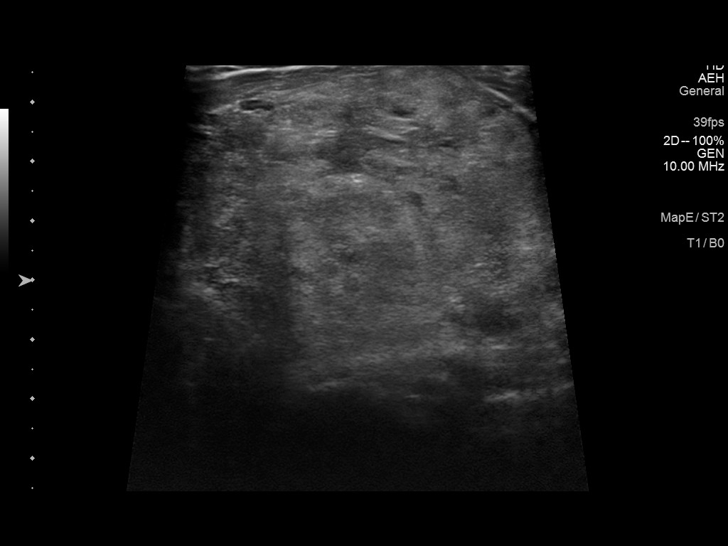
[im 40/64]
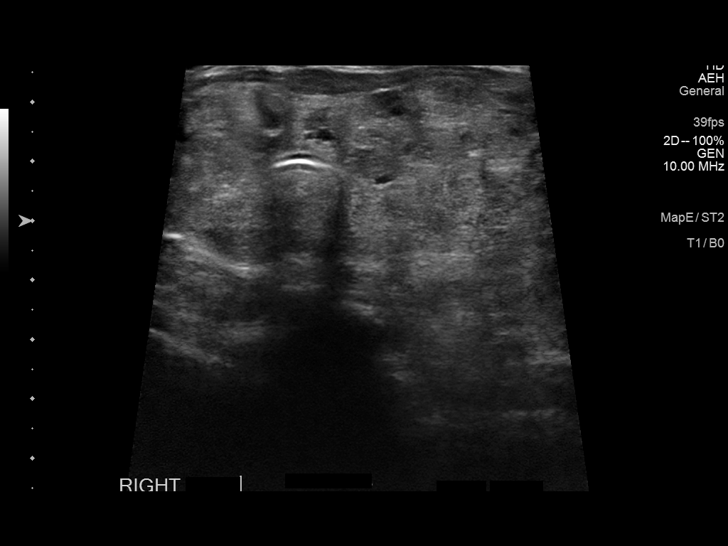
[im 45/64]
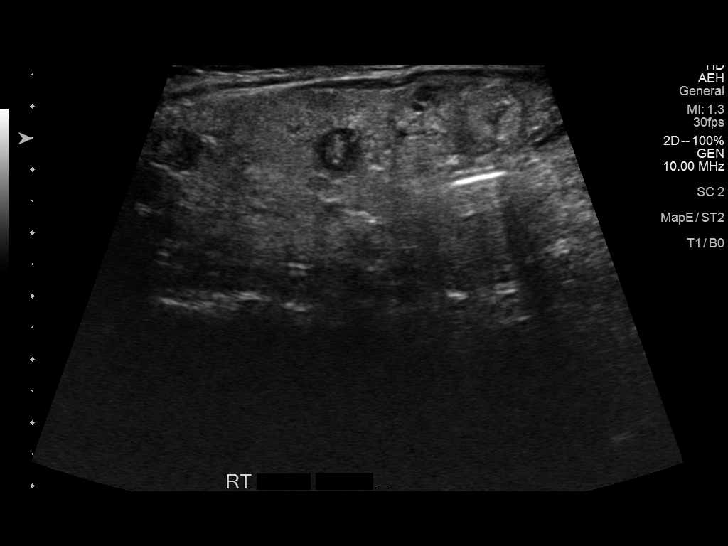
[im 50/64]
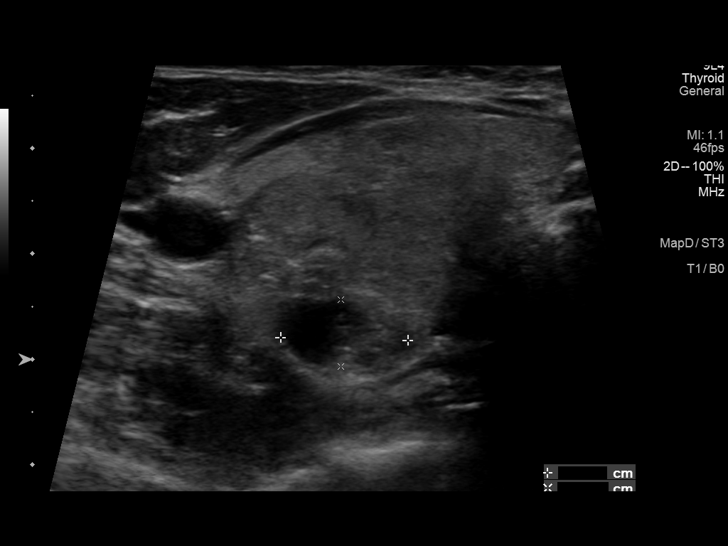
[im 56/64]
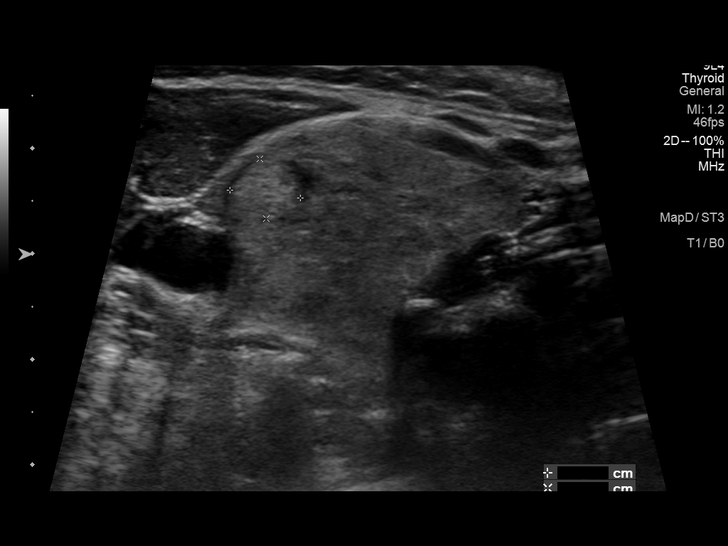
[im 61/64]
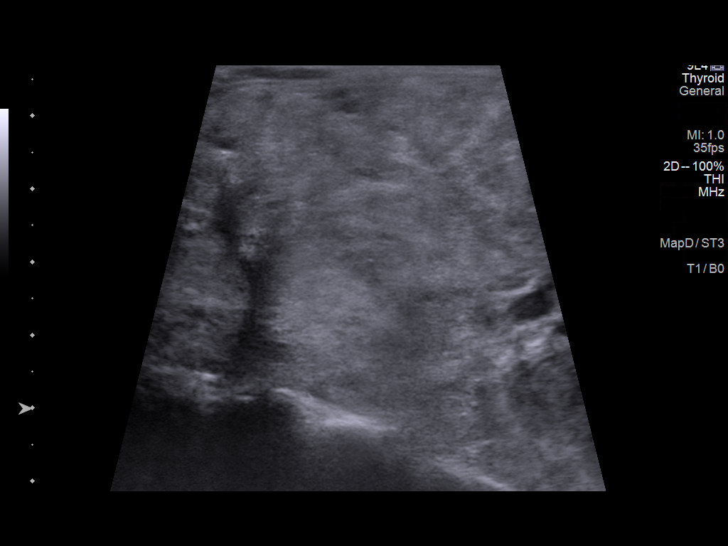

[12 of 25 positions shown; findings below may reference images not displayed]

11/12/2016; 10/16/2015; left-sided thyroid
nodule fine-needle aspiration - 04/15/2014
FINDINGS: Parenchymal Echotexture: Markedly heterogenous

Isthmus: Enlarged measuring 1 cm in diameter, unchanged

Right lobe: Enlarged measuring 6.4 x 2.8 x 2.4 cm, unchanged,
previously, 7.1 x 2.2 x 3.0 cm

Left lobe: Enlarged measuring 8.8 x 4.9 x 5.7 cm, unchanged,
previously, [IP_ADDRESS] x 5.3 cm.

_________________________________________________________

Estimated total number of nodules >/= 1 cm: 1

Number of spongiform nodules >/=  2 cm not described below (TR1): 0

Number of mixed cystic and solid nodules >/= 1.5 cm not described
below (TR2): 0

_________________________________________________________

The previously noted approximately 2.7 x 2.0 x 1.4 cm partially
cystic, partially solid nodule within the thyroid isthmus is not
definitely seen on the present examination.

_________________________________________________________

There is an approximately 0.6 x 0.6 x 0.5 cm hypoechoic nodule
within the superior pole of the right lobe of the thyroid (labeled
1), not definitely seen on previous examinations, though does not
meet imaging criteria to recommend percutaneous sampling or
continued dedicated follow-up.

There is an approximately 0.7 x 0.7 x 0.7 cm partially solid,
partially cystic nodule with the superior pole of the right lobe of
the thyroid (labeled 2), not definitely seen on previous
examinations though does not meet imaging criteria to recommend
percutaneous sampling or continued dedicated follow-up

There is an approximately 1.4 x 1.3 x 0.6 cm partially solid,
partially cystic nodule within the inferior pole of the right lobe
of the thyroid (labeled 3), not definitely seen on previous
examinations, though does not meet imaging criteria to recommend
percutaneous sampling or continued dedicated follow-up.

There is an approximately 0.8 x 0.7 x 0.6 cm isoechoic ill-defined
nodule/pseudonodule within the mid aspect the right lobe of the
thyroid (labeled 4), not definitely seen on previous examinations,
though does not meet imaging criteria to recommend percutaneous
sampling or continued dedicated follow-up.

_________________________________________________________

The previously identified approximately 2.0 x 1.3 x 1.8 cm isoechoic
ill-defined nodule/pseudonodule within the mid aspect of the right
lobe of the thyroid is not definitely seen on present examination
and neither is the approximately 1.4 x 1.3 x 0.8 cm isoechoic
ill-defined nodule/pseudonodule within the inferior pole of the
right lobe of the thyroid.

_________________________________________________________

Previously biopsied approximately 5.2 x 4.5 x 3.4 cm isoechoic
ill-defined nodule/pseudonodule within the mid aspect the left lobe
of the thyroid is not definitely seen on the present examination.
IMPRESSION: 1. Findings compatible multinodular goiter. No worrisome new or
enlarging thyroid nodules.
2. Previously biopsied approximately 5.2 cm ill-defined
nodule/pseudonodule within the left lobe of the thyroid is not
definitely seen on present examination. Correlation with prior
biopsy results is recommended though assuming benign pathologic
diagnosis, repeat sampling and/or continued dedicated follow-up is
not recommended.
3. Previously identified right-sided thyroid nodules are not
definitely seen on the present examination and thus favored to have
represented pseudo nodules.
4. None of the remaining thyroid nodules meet imaging criteria to
recommend percutaneous sampling or continued dedicated follow-up.

The above is in keeping with the ACR TI-RADS recommendations - [HOSPITAL] 5927;[DATE].

## 2020-10-03 MED FILL — AMPHETAMINE-DEXTROAMPHETAMI: 20 | 30 days supply | Qty: 45 | Fill #0

## 2021-01-01 ENCOUNTER — Ambulatory Visit: Payer: Self-pay | Admitting: Surgery

## 2021-01-01 NOTE — H&P (Signed)
General Surgery [facility]  Erica Coleman DOB: [birth year] Single / Language: [place] / Race: Black or African American Female   History of Present Illness   The patient is a 40 year old female who presents with a complaint of Enlarged thyroid.  CHIEF COMPLAINT: multinodular thyroid goiter  Patient is referred by Dr. Debara Pickett for consideration of thyroidectomy for management of multinodular thyroid goiter with mild hyperthyroidism. Patient has had a long-standing multinodular thyroid goiter. There is a family history of goiter in the patient's father and paternal grandmother. Recently the patient is noted an increase in size. She is also had fluctuations in her TSH level. Her most recent level was 0.36 in May 2021. Patient has never been on thyroid medications or anti-thyroid medications. She has been taking supplements. She has experienced hot flashes. She has had no prior head or neck surgery. 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 Ultrasound demonstrates a markedly enlarged thyroid gland which is asymmetric with the left lobe measuring nearly 9 cm in size in the right lobe measuring 7 cm in size. There are bilateral thyroid nodules. She is undergone previous fine-needle aspiration biopsy in 2015 which was benign. She presents today to discuss total thyroidectomy for management of multinodular goiter and borderline hyperthyroidism.   Past Surgical History  Oral Surgery   Diagnostic Studies History  Colonoscopy  never Mammogram  never Pap Smear  1-5 years ago  Allergies  Sulfa Drugs  Allergies Reconciled   Medication History  Amphetamine-Dextroamphetamine (10MG  Tablet, Oral two times daily) Active. ZyrTEC Allergy (10MG  Capsule, Oral) Active. Ashwagandha (500MG  Capsule, Oral) Active. Vitamin D3 (125 MCG(5000 UT) Tablet Chewable, Oral) Active. Vitamin K2 ( Tablet, Oral) Active. Garlic (10MG  Capsule, Oral)  Active. Omega 3 (1000MG  Capsule, Oral) Active. Pregnenolone (50MG  Tablet, Oral) Active. Medications Reconciled  Social History  Alcohol use  Occasional alcohol use. Caffeine use  Coffee, Tea. No drug use  Tobacco use  Former smoker.  Family History  Breast Cancer  Mother. Cerebrovascular Accident  Family Members In General. Diabetes Mellitus  Family Members In General. Hypertension  Mother. Kidney Disease  Brother. Prostate Cancer  Family Members In General. Thyroid problems  Father.  Pregnancy / Birth History  Age at menarche  17 years. Contraceptive History  Contraceptive implant. Gravida  0 Irregular periods  Para  0  Other Problems  Anxiety Disorder  Depression  Diabetes Mellitus  Heart murmur  High blood pressure  Thyroid Disease   Review of Systems  General Not Present- Appetite Loss, Chills, Fatigue, Fever, Night Sweats, Weight Gain and Weight Loss. Skin Not Present- Change in Wart/Mole, Dryness, Hives, Jaundice, New Lesions, Non-Healing Wounds, Rash and Ulcer. HEENT Present- Seasonal Allergies and Wears glasses/contact lenses. Not Present- Earache, Hearing Loss, Hoarseness, Nose Bleed, Oral Ulcers, Ringing in the Ears, Sinus Pain, Sore Throat, Visual Disturbances and Yellow Eyes. Respiratory Not Present- Bloody sputum, Chronic Cough, Difficulty Breathing, Snoring and Wheezing. Breast Not Present- Breast Mass, Breast Pain, Nipple Discharge and Skin Changes. Cardiovascular Not Present- Chest Pain, Difficulty Breathing Lying Down, Leg Cramps, Palpitations, Rapid Heart Rate, Shortness of Breath and Swelling of Extremities. Gastrointestinal Not Present- Abdominal Pain, Bloating, Bloody Stool, Change in Bowel Habits, Chronic diarrhea, Constipation, Difficulty Swallowing, Excessive gas, Gets full quickly at meals, Hemorrhoids, Indigestion, Nausea, Rectal Pain and Vomiting. Female Genitourinary Not Present- Frequency, Nocturia, Painful Urination,  Pelvic Pain and Urgency. Musculoskeletal Not Present- Back Pain, Joint Pain, Joint Stiffness, Muscle Pain, Muscle Weakness and Swelling of  Decreased Memory, Fainting, Headaches, Numbness, Seizures, Tingling, Tremor, Trouble walking and Weakness. Psychiatric Not Present- Anxiety, Bipolar, Change in Sleep Pattern, Depression, Fearful and Frequent crying. Endocrine Present- Hot flashes. Not Present- Cold Intolerance, Excessive Hunger, Hair Changes, Heat Intolerance and New Diabetes. Hematology Present- Gland problems. Not Present- Blood Thinners, Easy Bruising, Excessive bleeding, HIV and Persistent Infections.  Vitals Weight: 158.4 lb Height: 62in Body Surface Area: 1.73 m Body Mass Index: 28.97 kg/m  Temp.: 99.2F (Thermal Scan)  Pulse: 92 (Regular)  BP: 124/82(Sitting, Left Arm, Standard)  Physical Exam   GENERAL APPEARANCE Development: normal Nutritional status: normal Gross deformities: none  SKIN Rash, lesions, ulcers: none Induration, erythema: none Nodules: none palpable  EYES Conjunctiva and lids: normal Pupils: equal and reactive Iris: normal bilaterally  EARS, NOSE, MOUTH, THROAT External ears: no lesion or deformity External nose: no lesion or deformity Hearing: grossly normal Due to Covid-19 pandemic, patient is wearing a mask.  NECK Symmetric: no Trachea: midline Thyroid: Thyroid gland is markedly enlarged and situated relatively anteriorly. Left lobe is decidedly larger than the right. Those lobes are relatively firm and multinodular. There is no tenderness. There is no associated lymphadenopathy.  CHEST Respiratory effort: normal Retraction or accessory muscle use: no Breath sounds: normal bilaterally Rales, rhonchi, wheeze: none  CARDIOVASCULAR Auscultation: regular rhythm, normal rate Murmurs: none Pulses: radial pulse 2+ palpable Lower extremity edema: none  MUSCULOSKELETAL Station and gait:  normal Digits and nails: no clubbing or cyanosis Muscle strength: grossly normal all extremities Range of motion: grossly normal all extremities Deformity: none  LYMPHATIC Cervical: none palpable Supraclavicular: none palpable  PSYCHIATRIC Oriented to person, place, and time: yes Mood and affect: normal for situation Judgment and insight: appropriate for situation    Assessment & Plan  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 Jeff Kerr, for surgical evaluation and management of multinodular thyroid goiter with borderline hyperthyroidism.  Patient provided with a copy of "The Thyroid Book: Medical and Surgical Treatment of Thyroid Problems", published by Krames, 16 pages. Book reviewed and explained to patient during visit today.  Patient has a markedly enlarged thyroid gland which is asymmetric with the left lobe significantly larger than the right. She has bilateral thyroid nodules and has had previous benign biopsies. She has borderline hyperthyroidism with her most recent TSH level being mildly suppressed at 0.36. Today we discussed the procedure of total thyroidectomy for management of multinodular goiter with hyperthyroidism. We discussed the risk and benefits of the procedure. We discussed reasons for total thyroidectomy versus a more limited resection. We discussed the location and size of the surgical incision. We discussed the risk of recurrent laryngeal nerve injury and injury to parathyroid glands. We discussed the hospital stay to be anticipated as well as her postoperative recovery. We discussed the need for lifelong thyroid hormone replacement. She understands and wishes to proceed with surgery in the near future.  The risks and benefits of the procedure have been discussed at length with the patient. The patient understands the proposed procedure, potential alternative treatments, and the course of  recovery to be expected. All of the patient's questions have been answered at this time. The patient wishes to proceed with surgery.  Demarian Epps, MD [facility] Office: [phone]    

## 2021-01-15 MED FILL — CLINDAMYCIN PHOS-BENZOYL PE: 1-5 | 30 days supply | Qty: 50 | Fill #1

## 2021-01-16 ENCOUNTER — Other Ambulatory Visit (HOSPITAL_COMMUNITY): Payer: Self-pay | Admitting: Dermatology

## 2021-01-16 MED FILL — DAPSONE 7.5 % GEL: 7.5 | 90 days supply | Qty: 90 | Fill #0

## 2021-02-12 ENCOUNTER — Other Ambulatory Visit (HOSPITAL_COMMUNITY): Payer: Self-pay

## 2021-02-19 ENCOUNTER — Other Ambulatory Visit (HOSPITAL_COMMUNITY): Payer: Self-pay

## 2021-02-20 ENCOUNTER — Other Ambulatory Visit (HOSPITAL_COMMUNITY): Payer: Self-pay

## 2021-02-20 MED ORDER — AMPHETAMINE-DEXTROAMPHETAMINE 20 MG PO TABS
ORAL_TABLET | ORAL | 0 refills | Status: DC
  Filled 2021-02-20: qty 45, 30d supply, fill #0

## 2021-03-09 ENCOUNTER — Other Ambulatory Visit (HOSPITAL_COMMUNITY): Payer: Self-pay

## 2021-03-09 DIAGNOSIS — F900 Attention-deficit hyperactivity disorder, predominantly inattentive type: Secondary | ICD-10-CM | POA: Diagnosis not present

## 2021-03-09 MED ORDER — AMPHETAMINE-DEXTROAMPHETAMINE 20 MG PO TABS
ORAL_TABLET | ORAL | 0 refills | Status: DC
  Filled 2021-06-09: qty 45, 30d supply, fill #0

## 2021-03-09 MED ORDER — AMPHETAMINE-DEXTROAMPHETAMINE 20 MG PO TABS
ORAL_TABLET | ORAL | 0 refills | Status: DC
  Filled 2021-04-20: qty 45, 30d supply, fill #0

## 2021-03-09 MED ORDER — AMPHETAMINE-DEXTROAMPHETAMINE 20 MG PO TABS
ORAL_TABLET | ORAL | 0 refills | Status: DC
  Filled 2021-08-02: qty 45, 30d supply, fill #0

## 2021-03-18 ENCOUNTER — Encounter: Payer: Self-pay | Admitting: Surgery

## 2021-03-18 DIAGNOSIS — E0520 Thyrotoxicosis with toxic multinodular goiter without thyrotoxic crisis or storm: Secondary | ICD-10-CM | POA: Diagnosis present

## 2021-03-18 NOTE — H&P (Signed)
General Surgery [facility]  Erica Coleman DOB: [birth year] Single / Language: [place] / Race: Black or African American Female   History of Present Illness  The patient is a 40 year old female who presents with a complaint of Enlarged thyroid.  CHIEF COMPLAINT: multinodular thyroid goiter  Patient is referred by Dr. Debara Pickett for consideration of thyroidectomy for management of multinodular thyroid goiter with mild hyperthyroidism. Patient has had a long-standing multinodular thyroid goiter. There is a family history of goiter in the patient's father and paternal grandmother. Recently the patient is noted an increase in size. She is also had fluctuations in her TSH level. Her most recent level was 0.36 in May 2021. Patient has never been on thyroid medications or anti-thyroid medications. She has been taking supplements. She has experienced hot flashes. She has had no prior head or neck surgery. 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 Ultrasound demonstrates a markedly enlarged thyroid gland which is asymmetric with the left lobe measuring nearly 9 cm in size in the right lobe measuring 7 cm in size. There are bilateral thyroid nodules. She is undergone previous fine-needle aspiration biopsy in 2015 which was benign. She presents today to discuss total thyroidectomy for management of multinodular goiter and borderline hyperthyroidism.   Past Surgical History  Oral Surgery   Diagnostic Studies History  Colonoscopy  never Mammogram  never Pap Smear  1-5 years ago  Allergies  Sulfa Drugs  Allergies Reconciled   Medication History  Amphetamine-Dextroamphetamine (10MG  Tablet, Oral two times daily) Active. ZyrTEC Allergy (10MG  Capsule, Oral) Active. Ashwagandha (500MG  Capsule, Oral) Active. Vitamin D3 (125 MCG(5000 UT) Tablet Chewable, Oral) Active. Vitamin K2 ( Tablet, Oral) Active. Garlic (10MG  Capsule, Oral)  Active. Omega 3 (1000MG  Capsule, Oral) Active. Pregnenolone (50MG  Tablet, Oral) Active. Medications Reconciled  Social History Alcohol use  Occasional alcohol use. Caffeine use  Coffee, Tea. No drug use  Tobacco use  Former smoker.  Family History  Breast Cancer  Mother. Cerebrovascular Accident  Family Members In General. Diabetes Mellitus  Family Members In General. Hypertension  Mother. Kidney Disease  Brother. Prostate Cancer  Family Members In General. Thyroid problems  Father.  Pregnancy / Birth History Age at menarche  17 years. Contraceptive History  Contraceptive implant. Gravida  0 Irregular periods  Para  0  Other Problems  Anxiety Disorder  Depression  Diabetes Mellitus  Heart murmur  High blood pressure  Thyroid Disease   Review of Systems General Not Present- Appetite Loss, Chills, Fatigue, Fever, Night Sweats, Weight Gain and Weight Loss. Skin Not Present- Change in Wart/Mole, Dryness, Hives, Jaundice, New Lesions, Non-Healing Wounds, Rash and Ulcer. HEENT Present- Seasonal Allergies and Wears glasses/contact lenses. Not Present- Earache, Hearing Loss, Hoarseness, Nose Bleed, Oral Ulcers, Ringing in the Ears, Sinus Pain, Sore Throat, Visual Disturbances and Yellow Eyes. Respiratory Not Present- Bloody sputum, Chronic Cough, Difficulty Breathing, Snoring and Wheezing. Breast Not Present- Breast Mass, Breast Pain, Nipple Discharge and Skin Changes. Cardiovascular Not Present- Chest Pain, Difficulty Breathing Lying Down, Leg Cramps, Palpitations, Rapid Heart Rate, Shortness of Breath and Swelling of Extremities. Gastrointestinal Not Present- Abdominal Pain, Bloating, Bloody Stool, Change in Bowel Habits, Chronic diarrhea, Constipation, Difficulty Swallowing, Excessive gas, Gets full quickly at meals, Hemorrhoids, Indigestion, Nausea, Rectal Pain and Vomiting. Female Genitourinary Not Present- Frequency, Nocturia, Painful Urination,  Pelvic Pain and Urgency. Musculoskeletal Not Present- Back Pain, Joint Pain, Joint Stiffness, Muscle Pain, Muscle Weakness and Swelling of Extremities. Neurological Not Present-  Decreased Memory, Fainting, Headaches, Numbness, Seizures, Tingling, Tremor, Trouble walking and Weakness. Psychiatric Not Present- Anxiety, Bipolar, Change in Sleep Pattern, Depression, Fearful and Frequent crying. Endocrine Present- Hot flashes. Not Present- Cold Intolerance, Excessive Hunger, Hair Changes, Heat Intolerance and New Diabetes. Hematology Present- Gland problems. Not Present- Blood Thinners, Easy Bruising, Excessive bleeding, HIV and Persistent Infections.  Vitals Weight: 158.4 lb Height: 62in Body Surface Area: 1.73 m Body Mass Index: 28.97 kg/m  Temp.: 99.38F (Thermal Scan)  Pulse: 92 (Regular)  BP: 124/82(Sitting, Left Arm, Standard)  Physical Exam   GENERAL APPEARANCE Development: normal Nutritional status: normal Gross deformities: none  SKIN Rash, lesions, ulcers: none Induration, erythema: none Nodules: none palpable  EYES Conjunctiva and lids: normal Pupils: equal and reactive Iris: normal bilaterally  EARS, NOSE, MOUTH, THROAT External ears: no lesion or deformity External nose: no lesion or deformity Hearing: grossly normal Due to Covid-19 pandemic, patient is wearing a mask.  NECK Symmetric: no Trachea: midline Thyroid: Thyroid gland is markedly enlarged and situated relatively anteriorly. Left lobe is decidedly larger than the right. Those lobes are relatively firm and multinodular. There is no tenderness. There is no associated lymphadenopathy.  CHEST Respiratory effort: normal Retraction or accessory muscle use: no Breath sounds: normal bilaterally Rales, rhonchi, wheeze: none  CARDIOVASCULAR Auscultation: regular rhythm, normal rate Murmurs: none Pulses: radial pulse 2+ palpable Lower extremity edema: none  MUSCULOSKELETAL Station and gait:  normal Digits and nails: no clubbing or cyanosis Muscle strength: grossly normal all extremities Range of motion: grossly normal all extremities Deformity: none  LYMPHATIC Cervical: none palpable Supraclavicular: none palpable  PSYCHIATRIC Oriented to person, place, and time: yes Mood and affect: normal for situation Judgment and insight: appropriate for situation    Assessment & Plan  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 Debara Pickett, for surgical evaluation and management of multinodular thyroid goiter with borderline hyperthyroidism.  Patient provided with a copy of "The Thyroid Book: Medical and Surgical Treatment of Thyroid Problems", published by Krames, 16 pages. Book reviewed and explained to patient during visit today.  Patient has a markedly enlarged thyroid gland which is asymmetric with the left lobe significantly larger than the right. She has bilateral thyroid nodules and has had previous benign biopsies. She has borderline hyperthyroidism with her most recent TSH level being mildly suppressed at 0.36. Today we discussed the procedure of total thyroidectomy for management of multinodular goiter with hyperthyroidism. We discussed the risk and benefits of the procedure. We discussed reasons for total thyroidectomy versus a more limited resection. We discussed the location and size of the surgical incision. We discussed the risk of recurrent laryngeal nerve injury and injury to parathyroid glands. We discussed the hospital stay to be anticipated as well as her postoperative recovery. We discussed the need for lifelong thyroid hormone replacement. She understands and wishes to proceed with surgery in the near future.  The risks and benefits of the procedure have been discussed at length with the patient. The patient understands the proposed procedure, potential alternative treatments, and the course of  recovery to be expected. All of the patient's questions have been answered at this time. The patient wishes to proceed with surgery.  Darnell Level, MD [facility] Office: [phone]

## 2021-03-19 NOTE — Patient Instructions (Addendum)
DUE TO COVID-19 ONLY ONE VISITOR IS ALLOWED TO COME WITH YOU AND STAY IN THE WAITING ROOM ONLY DURING PRE OP AND PROCEDURE DAY OF SURGERY. THE 2 VISITORS  MAY VISIT WITH YOU AFTER SURGERY IN YOUR PRIVATE ROOM DURING VISITING HOURS ONLY!  YOU NEED TO HAVE A COVID 19 TEST ON__5/17_____ @__2 :30 p_____, THIS TEST MUST BE DONE BEFORE SURGERY,  COVID TESTING SITE [address] [place] , IT IS ON THE RIGHT GOING OUT [address] APPROXIMATELY  2 MINUTES PAST [employer] ON THE RIGHT. ONCE YOUR COVID TEST IS COMPLETED,  PLEASE BEGIN THE QUARANTINE INSTRUCTIONS AS OUTLINED IN YOUR HANDOUT.                [id number]    Your procedure is scheduled on: 03/30/21   Report to [facility] Main  Entrance   Report to admitting at   7:30 AM     Call this number if you have problems the morning of surgery [phone]    Remember: Do not eat food after Midnight.  You may have clear liquids until 6:30 AM   BRUSH YOUR TEETH MORNING OF SURGERY AND RINSE YOUR MOUTH OUT, NO CHEWING GUM CANDY OR MINTS.     Take these medicines the morning of surgery with A SIP OF WATER: Thyroid                                You may not have any metal on your body including hair pins and              piercings  Do not wear jewelry, make-up, lotions, powders or perfumes, deodorant             Do not wear nail polish on your fingernails.  Do not shave  48 hours prior to surgery.     Do not bring valuables to the hospital. [facility] IS NOT             RESPONSIBLE   FOR VALUABLES.  Contacts, dentures or bridgework may not be worn into surgery.      [facility] - Preparing for Surgery Before surgery, you can play an important role.  Because skin is not sterile, your skin needs to be as free of germs as possible.  You can reduce the number of germs on your skin by washing with CHG (chlorahexidine gluconate) soap before surgery.  CHG is an antiseptic cleaner which kills germs and  bonds with the skin to continue killing germs even after washing. Please DO NOT use if you have an allergy to CHG or antibacterial soaps.  If your skin becomes reddened/irritated stop using the CHG and inform your nurse when you arrive at Short Stay. Do not shave (including legs and underarms) for at least 48 hours prior to the first CHG shower.    Please follow these instructions carefully:  1.  Shower with CHG Soap the night before surgery and the  morning of Surgery.  2.  If you choose to wash your hair, wash your hair first as usual with your  normal  shampoo.  3.  After you shampoo, rinse your hair and body thoroughly to remove the  shampoo.  4.  Use CHG as you would any other liquid soap.  You can apply chg directly  to the skin and wash                       Gently with a scrungie or clean washcloth.  5.  Apply the CHG Soap to your body ONLY FROM THE NECK DOWN.   Do not use on face/ open                           Wound or open sores. Avoid contact with eyes, ears mouth and genitals (private parts).                       Wash face,  Genitals (private parts) with your normal soap.             6.  Wash thoroughly, paying special attention to the area where your surgery  will be performed.  7.  Thoroughly rinse your body with warm water from the neck down.  8.  DO NOT shower/wash with your normal soap after using and rinsing off  the CHG Soap.             9.  Pat yourself dry with a clean towel.            10.  Wear clean pajamas.            11.  Place clean sheets on your bed the night of your first shower and do not  sleep with pets. Day of Surgery : Do not apply any lotions/deodorants the morning of surgery.  Please wear clean clothes to the hospital/surgery center.  FAILURE TO FOLLOW THESE INSTRUCTIONS MAY RESULT IN THE CANCELLATION OF YOUR SURGERY PATIENT SIGNATURE_________________________________  NURSE  SIGNATURE__________________________________  ________________________________________________________________________

## 2021-03-20 ENCOUNTER — Ambulatory Visit (HOSPITAL_COMMUNITY)
Admission: RE | Admit: 2021-03-20 | Discharge: 2021-03-20 | Disposition: A | Discharge status: Home / Self Care | Payer: 59 | Source: Ambulatory Visit | Attending: Anesthesiology | Admitting: Anesthesiology

## 2021-03-20 ENCOUNTER — Encounter (HOSPITAL_COMMUNITY): Payer: Self-pay

## 2021-03-20 ENCOUNTER — Other Ambulatory Visit: Payer: Self-pay

## 2021-03-20 ENCOUNTER — Encounter (HOSPITAL_COMMUNITY)
Admission: RE | Admit: 2021-03-20 | Discharge: 2021-03-20 | Disposition: A | Discharge status: Home / Self Care | Payer: 59 | Source: Ambulatory Visit | Attending: Surgery | Admitting: Surgery

## 2021-03-20 DIAGNOSIS — E079 Disorder of thyroid, unspecified: Secondary | ICD-10-CM | POA: Diagnosis not present

## 2021-03-20 DIAGNOSIS — Z01818 Encounter for other preprocedural examination: Secondary | ICD-10-CM | POA: Diagnosis not present

## 2021-03-20 HISTORY — DX: Cardiac murmur, unspecified: R01.1

## 2021-03-20 HISTORY — DX: Prediabetes: R73.03

## 2021-03-20 HISTORY — DX: Essential (primary) hypertension: I10

## 2021-03-20 LAB — CBC
HCT: 40 % (ref 36.0–46.0)
Hemoglobin: 13.4 g/dL (ref 12.0–15.0)
MCH: 29.8 pg (ref 26.0–34.0)
MCHC: 33.5 g/dL (ref 30.0–36.0)
MCV: 88.9 fL (ref 80.0–100.0)
Platelets: 299 10*3/uL (ref 150–400)
RBC: 4.5 MIL/uL (ref 3.87–5.11)
RDW: 13.1 % (ref 11.5–15.5)
WBC: 8.1 10*3/uL (ref 4.0–10.5)
nRBC: 0 % (ref 0.0–0.2)

## 2021-03-20 NOTE — Progress Notes (Addendum)
COVID Vaccine Completed:Yes Date COVID Vaccine completed:02/21/20 COVID vaccine manufacturer:   [employer]   PCP - Everrett Coombe DO Cardiologist - no  Chest x-ray - no EKG - 03/20/21-chart, epic Stress Test - no ECHO -no  Cardiac Cath - no Pacemaker/ICD device last checked:NA  Sleep Study - no CPAP -   Fasting Blood Sugar - Diet controlled. A1cs have been fine for years. Last one was 5.4 Checks Blood Sugar _____ times a day  Blood Thinner Instructions:NA Aspirin Instructions: Last Dose:  Anesthesia review:   Patient denies shortness of breath, fever, cough and chest pain at PAT appointment  Yes. Pt reports no SOB with any activities.  Patient verbalized understanding of instructions that were given to them at the PAT appointment. Patient was also instructed that they will need to review over the PAT instructions again at home before surgery.Yes. pt reports she thinks she had a heart murmur as a child.  Pt's diastolic BP was 92. She said she was on medication a few years age and changed what she was eating and is taking supplements and no longer needed meds. I told her to monitor her BP at home and call her Dr. If it stays elevated.

## 2021-03-24 ENCOUNTER — Encounter (HOSPITAL_COMMUNITY): Payer: Self-pay | Admitting: Surgery

## 2021-03-27 ENCOUNTER — Other Ambulatory Visit (HOSPITAL_COMMUNITY)
Admission: RE | Admit: 2021-03-27 | Discharge: 2021-03-27 | Disposition: A | Discharge status: Home / Self Care | Payer: 59 | Source: Ambulatory Visit | Attending: Surgery | Admitting: Surgery

## 2021-03-27 DIAGNOSIS — Z01812 Encounter for preprocedural laboratory examination: Secondary | ICD-10-CM | POA: Diagnosis not present

## 2021-03-27 DIAGNOSIS — Z20822 Contact with and (suspected) exposure to covid-19: Secondary | ICD-10-CM | POA: Diagnosis not present

## 2021-03-27 LAB — SARS CORONAVIRUS 2 (TAT 6-24 HRS): SARS Coronavirus 2: NEGATIVE

## 2021-03-30 ENCOUNTER — Ambulatory Visit (HOSPITAL_COMMUNITY): Payer: 59 | Admitting: Physician Assistant

## 2021-03-30 ENCOUNTER — Other Ambulatory Visit: Payer: Self-pay

## 2021-03-30 ENCOUNTER — Encounter (HOSPITAL_COMMUNITY)
Admission: RE | Disposition: A | Discharge status: Home / Self Care | Payer: Self-pay | Source: Ambulatory Visit | Attending: Surgery

## 2021-03-30 ENCOUNTER — Ambulatory Visit (HOSPITAL_COMMUNITY)
Admission: RE | Admit: 2021-03-30 | Discharge: 2021-03-31 | Disposition: A | Discharge status: Home / Self Care | Payer: 59 | Source: Ambulatory Visit | Attending: Surgery | Admitting: Surgery

## 2021-03-30 ENCOUNTER — Encounter (HOSPITAL_COMMUNITY): Payer: Self-pay | Admitting: Surgery

## 2021-03-30 ENCOUNTER — Ambulatory Visit (HOSPITAL_COMMUNITY): Payer: 59 | Admitting: Certified Registered Nurse Anesthetist

## 2021-03-30 DIAGNOSIS — Z79899 Other long term (current) drug therapy: Secondary | ICD-10-CM | POA: Insufficient documentation

## 2021-03-30 DIAGNOSIS — Z841 Family history of disorders of kidney and ureter: Secondary | ICD-10-CM | POA: Insufficient documentation

## 2021-03-30 DIAGNOSIS — Z882 Allergy status to sulfonamides status: Secondary | ICD-10-CM | POA: Insufficient documentation

## 2021-03-30 DIAGNOSIS — Z87891 Personal history of nicotine dependence: Secondary | ICD-10-CM | POA: Diagnosis not present

## 2021-03-30 DIAGNOSIS — E119 Type 2 diabetes mellitus without complications: Secondary | ICD-10-CM | POA: Insufficient documentation

## 2021-03-30 DIAGNOSIS — Z8349 Family history of other endocrine, nutritional and metabolic diseases: Secondary | ICD-10-CM | POA: Insufficient documentation

## 2021-03-30 DIAGNOSIS — Z8042 Family history of malignant neoplasm of prostate: Secondary | ICD-10-CM | POA: Insufficient documentation

## 2021-03-30 DIAGNOSIS — E0520 Thyrotoxicosis with toxic multinodular goiter without thyrotoxic crisis or storm: Secondary | ICD-10-CM | POA: Diagnosis not present

## 2021-03-30 DIAGNOSIS — Z823 Family history of stroke: Secondary | ICD-10-CM | POA: Diagnosis not present

## 2021-03-30 DIAGNOSIS — I10 Essential (primary) hypertension: Secondary | ICD-10-CM | POA: Insufficient documentation

## 2021-03-30 DIAGNOSIS — Z803 Family history of malignant neoplasm of breast: Secondary | ICD-10-CM | POA: Diagnosis not present

## 2021-03-30 DIAGNOSIS — R7303 Prediabetes: Secondary | ICD-10-CM | POA: Diagnosis not present

## 2021-03-30 DIAGNOSIS — E213 Hyperparathyroidism, unspecified: Secondary | ICD-10-CM | POA: Diagnosis not present

## 2021-03-30 DIAGNOSIS — E042 Nontoxic multinodular goiter: Secondary | ICD-10-CM | POA: Diagnosis not present

## 2021-03-30 DIAGNOSIS — Z833 Family history of diabetes mellitus: Secondary | ICD-10-CM | POA: Diagnosis not present

## 2021-03-30 DIAGNOSIS — E049 Nontoxic goiter, unspecified: Secondary | ICD-10-CM | POA: Diagnosis present

## 2021-03-30 HISTORY — PX: THYROIDECTOMY: SHX17

## 2021-03-30 LAB — PREGNANCY, URINE: Preg Test, Ur: NEGATIVE

## 2021-03-30 SURGERY — THYROIDECTOMY
Anesthesia: General | Site: Neck

## 2021-03-30 MED ORDER — FENTANYL CITRATE (PF) 100 MCG/2ML IJ SOLN
INTRAMUSCULAR | Status: AC
  Filled 2021-03-30: qty 2

## 2021-03-30 MED ORDER — ONDANSETRON 4 MG PO TBDP: 4.0000 mg | ORAL_TABLET | Freq: Four times a day (QID) | ORAL | Status: DC | PRN

## 2021-03-30 MED ORDER — ORAL CARE MOUTH RINSE: 15.0000 mL | Freq: Once | OROMUCOSAL | Status: AC

## 2021-03-30 MED ORDER — AMISULPRIDE (ANTIEMETIC) 5 MG/2ML IV SOLN: 10.0000 mg | Freq: Once | INTRAVENOUS | Status: DC | PRN

## 2021-03-30 MED ORDER — AMPHETAMINE-DEXTROAMPHETAMINE 10 MG PO TABS: 20.0000 mg | ORAL_TABLET | Freq: Every day | ORAL | Status: DC

## 2021-03-30 MED ORDER — ACETAMINOPHEN 650 MG RE SUPP: 650.0000 mg | Freq: Four times a day (QID) | RECTAL | Status: DC | PRN

## 2021-03-30 MED ORDER — FENTANYL CITRATE (PF) 100 MCG/2ML IJ SOLN
25.0000 ug | INTRAMUSCULAR | Status: DC | PRN
  Administered 2021-03-30: 50 ug via INTRAVENOUS
  Administered 2021-03-30: 25 ug via INTRAVENOUS

## 2021-03-30 MED ORDER — SODIUM CHLORIDE 0.9 % IR SOLN
Status: DC | PRN
  Administered 2021-03-30: 1

## 2021-03-30 MED ORDER — PHENYLEPHRINE 40 MCG/ML (10ML) SYRINGE FOR IV PUSH (FOR BLOOD PRESSURE SUPPORT)
PREFILLED_SYRINGE | INTRAVENOUS | Status: AC
  Filled 2021-03-30: qty 10

## 2021-03-30 MED ORDER — HYDROMORPHONE HCL 1 MG/ML IJ SOLN: 1.0000 mg | INTRAMUSCULAR | Status: DC | PRN

## 2021-03-30 MED ORDER — MIDAZOLAM HCL 5 MG/5ML IJ SOLN
INTRAMUSCULAR | Status: DC | PRN
  Administered 2021-03-30: 2 mg via INTRAVENOUS

## 2021-03-30 MED ORDER — PHENYLEPHRINE 40 MCG/ML (10ML) SYRINGE FOR IV PUSH (FOR BLOOD PRESSURE SUPPORT)
PREFILLED_SYRINGE | INTRAVENOUS | Status: DC | PRN
  Administered 2021-03-30 (×2): 40 ug via INTRAVENOUS

## 2021-03-30 MED ORDER — PROPOFOL 10 MG/ML IV BOLUS
INTRAVENOUS | Status: AC
  Filled 2021-03-30: qty 20

## 2021-03-30 MED ORDER — LIDOCAINE 2% (20 MG/ML) 5 ML SYRINGE
INTRAMUSCULAR | Status: AC
  Filled 2021-03-30: qty 5

## 2021-03-30 MED ORDER — CEFAZOLIN SODIUM-DEXTROSE 2-4 GM/100ML-% IV SOLN
2.0000 g | INTRAVENOUS | Status: AC
  Administered 2021-03-30: 2 g via INTRAVENOUS
  Filled 2021-03-30: qty 100

## 2021-03-30 MED ORDER — ACETAMINOPHEN 325 MG PO TABS
650.0000 mg | ORAL_TABLET | Freq: Four times a day (QID) | ORAL | Status: DC | PRN
  Administered 2021-03-31: 650 mg via ORAL
  Filled 2021-03-30: qty 2

## 2021-03-30 MED ORDER — OXYCODONE HCL 5 MG PO TABS
ORAL_TABLET | ORAL | Status: AC
  Filled 2021-03-30: qty 1

## 2021-03-30 MED ORDER — FENTANYL CITRATE (PF) 250 MCG/5ML IJ SOLN
INTRAMUSCULAR | Status: DC | PRN
  Administered 2021-03-30: 150 ug via INTRAVENOUS
  Administered 2021-03-30: 50 ug via INTRAVENOUS
  Administered 2021-03-30: 100 ug via INTRAVENOUS

## 2021-03-30 MED ORDER — OXYCODONE HCL 5 MG PO TABS
5.0000 mg | ORAL_TABLET | Freq: Once | ORAL | Status: AC | PRN
  Administered 2021-03-30: 5 mg via ORAL

## 2021-03-30 MED ORDER — PROPOFOL 500 MG/50ML IV EMUL
INTRAVENOUS | Status: AC
  Filled 2021-03-30: qty 50

## 2021-03-30 MED ORDER — SODIUM CHLORIDE 0.45 % IV SOLN: INTRAVENOUS | Status: DC

## 2021-03-30 MED ORDER — SCOPOLAMINE 1 MG/3DAYS TD PT72
1.0000 | MEDICATED_PATCH | TRANSDERMAL | Status: DC
  Administered 2021-03-30: 1.5 mg via TRANSDERMAL
  Filled 2021-03-30: qty 1

## 2021-03-30 MED ORDER — PROMETHAZINE HCL 25 MG/ML IJ SOLN: 6.2500 mg | INTRAMUSCULAR | Status: DC | PRN

## 2021-03-30 MED ORDER — LACTATED RINGERS IV SOLN: INTRAVENOUS | Status: DC

## 2021-03-30 MED ORDER — CHLORHEXIDINE GLUCONATE CLOTH 2 % EX PADS: 6.0000 | MEDICATED_PAD | Freq: Once | CUTANEOUS | Status: DC

## 2021-03-30 MED ORDER — AMPHETAMINE-DEXTROAMPHETAMINE 10 MG PO TABS: 10.0000 mg | ORAL_TABLET | Freq: Every day | ORAL | Status: DC

## 2021-03-30 MED ORDER — TRAMADOL HCL 50 MG PO TABS: 50.0000 mg | ORAL_TABLET | Freq: Four times a day (QID) | ORAL | Status: DC | PRN

## 2021-03-30 MED ORDER — ONDANSETRON HCL 4 MG/2ML IJ SOLN
INTRAMUSCULAR | Status: DC | PRN
  Administered 2021-03-30: 4 mg via INTRAVENOUS

## 2021-03-30 MED ORDER — OXYCODONE HCL 5 MG PO TABS: 5.0000 mg | ORAL_TABLET | ORAL | Status: DC | PRN

## 2021-03-30 MED ORDER — ROCURONIUM BROMIDE 10 MG/ML (PF) SYRINGE
PREFILLED_SYRINGE | INTRAVENOUS | Status: AC
  Filled 2021-03-30: qty 10

## 2021-03-30 MED ORDER — MIDAZOLAM HCL 2 MG/2ML IJ SOLN
INTRAMUSCULAR | Status: AC
  Filled 2021-03-30: qty 2

## 2021-03-30 MED ORDER — OXYCODONE HCL 5 MG/5ML PO SOLN
5.0000 mg | Freq: Once | ORAL | Status: AC | PRN
Start: 2021-03-30 — End: 2021-03-30

## 2021-03-30 MED ORDER — AMPHETAMINE-DEXTROAMPHETAMINE 10 MG PO TABS: 10.0000 mg | ORAL_TABLET | ORAL | Status: DC

## 2021-03-30 MED ORDER — PROPOFOL 10 MG/ML IV BOLUS
INTRAVENOUS | Status: DC | PRN
  Administered 2021-03-30: 200 mg via INTRAVENOUS

## 2021-03-30 MED ORDER — ESMOLOL HCL 100 MG/10ML IV SOLN
INTRAVENOUS | Status: DC | PRN
  Administered 2021-03-30: 30 mg via INTRAVENOUS

## 2021-03-30 MED ORDER — FENTANYL CITRATE (PF) 250 MCG/5ML IJ SOLN
INTRAMUSCULAR | Status: AC
  Filled 2021-03-30: qty 5

## 2021-03-30 MED ORDER — ACETAMINOPHEN 500 MG PO TABS
1000.0000 mg | ORAL_TABLET | Freq: Once | ORAL | Status: AC
  Administered 2021-03-30: 1000 mg via ORAL
  Filled 2021-03-30: qty 2

## 2021-03-30 MED ORDER — CALCIUM CARBONATE 1250 (500 CA) MG PO TABS
2.0000 | ORAL_TABLET | Freq: Three times a day (TID) | ORAL | Status: DC
  Administered 2021-03-30 – 2021-03-31 (×2): 1000 mg via ORAL
  Filled 2021-03-30 (×2): qty 1

## 2021-03-30 MED ORDER — ONDANSETRON HCL 4 MG/2ML IJ SOLN
INTRAMUSCULAR | Status: AC
  Filled 2021-03-30: qty 2

## 2021-03-30 MED ORDER — ONDANSETRON HCL 4 MG/2ML IJ SOLN: 4.0000 mg | Freq: Four times a day (QID) | INTRAMUSCULAR | Status: DC | PRN

## 2021-03-30 MED ORDER — DEXAMETHASONE SODIUM PHOSPHATE 10 MG/ML IJ SOLN
INTRAMUSCULAR | Status: AC
  Filled 2021-03-30: qty 1

## 2021-03-30 MED ORDER — SUGAMMADEX SODIUM 200 MG/2ML IV SOLN
INTRAVENOUS | Status: DC | PRN
  Administered 2021-03-30: 200 mg via INTRAVENOUS

## 2021-03-30 MED ORDER — LIDOCAINE 2% (20 MG/ML) 5 ML SYRINGE
INTRAMUSCULAR | Status: DC | PRN
  Administered 2021-03-30: 60 mg via INTRAVENOUS

## 2021-03-30 MED ORDER — ROCURONIUM BROMIDE 10 MG/ML (PF) SYRINGE
PREFILLED_SYRINGE | INTRAVENOUS | Status: DC | PRN
  Administered 2021-03-30: 10 mg via INTRAVENOUS
  Administered 2021-03-30: 20 mg via INTRAVENOUS
  Administered 2021-03-30: 10 mg via INTRAVENOUS
  Administered 2021-03-30: 40 mg via INTRAVENOUS

## 2021-03-30 MED ORDER — DEXAMETHASONE SODIUM PHOSPHATE 10 MG/ML IJ SOLN
INTRAMUSCULAR | Status: DC | PRN
  Administered 2021-03-30: 10 mg via INTRAVENOUS

## 2021-03-30 MED ORDER — CHLORHEXIDINE GLUCONATE 0.12 % MT SOLN
15.0000 mL | Freq: Once | OROMUCOSAL | Status: AC
  Administered 2021-03-30: 15 mL via OROMUCOSAL

## 2021-03-30 SURGICAL SUPPLY — 29 items
ATTRACTOMAT 16X20 MAGNETIC DRP (DRAPES) ×2 IMPLANT
BLADE SURG 15 STRL LF DISP TIS (BLADE) ×1 IMPLANT
BLADE SURG 15 STRL SS (BLADE) ×1
CHLORAPREP W/TINT 26 (MISCELLANEOUS) ×2 IMPLANT
CLIP VESOCCLUDE MED 6/CT (CLIP) ×14 IMPLANT
CLIP VESOCCLUDE SM WIDE 6/CT (CLIP) ×6 IMPLANT
COVER SURGICAL LIGHT HANDLE (MISCELLANEOUS) ×2 IMPLANT
COVER WAND RF STERILE (DRAPES) ×2 IMPLANT
DERMABOND ADVANCED (GAUZE/BANDAGES/DRESSINGS) ×1
DERMABOND ADVANCED .7 DNX12 (GAUZE/BANDAGES/DRESSINGS) ×1 IMPLANT
DRAPE LAPAROTOMY T 98X78 PEDS (DRAPES) ×2 IMPLANT
DRAPE UTILITY XL STRL (DRAPES) ×2 IMPLANT
ELECT PENCIL ROCKER SW 15FT (MISCELLANEOUS) ×2 IMPLANT
ELECT REM PT RETURN 15FT ADLT (MISCELLANEOUS) ×2 IMPLANT
GAUZE 4X4 16PLY RFD (DISPOSABLE) ×4 IMPLANT
GLOVE SURG ORTHO LTX SZ8 (GLOVE) ×2 IMPLANT
GOWN STRL REUS W/TWL XL LVL3 (GOWN DISPOSABLE) ×4 IMPLANT
HEMOSTAT SURGICEL 2X4 FIBR (HEMOSTASIS) ×2 IMPLANT
ILLUMINATOR WAVEGUIDE N/F (MISCELLANEOUS) ×2 IMPLANT
KIT BASIN OR (CUSTOM PROCEDURE TRAY) ×2 IMPLANT
KIT TURNOVER KIT A (KITS) ×2 IMPLANT
PACK BASIC VI WITH GOWN DISP (CUSTOM PROCEDURE TRAY) ×2 IMPLANT
SHEARS HARMONIC 9CM CVD (BLADE) ×2 IMPLANT
SUT MNCRL AB 4-0 PS2 18 (SUTURE) ×2 IMPLANT
SUT VIC AB 3-0 SH 18 (SUTURE) ×4 IMPLANT
SYR BULB IRRIG 60ML STRL (SYRINGE) ×2 IMPLANT
TOWEL OR 17X26 10 PK STRL BLUE (TOWEL DISPOSABLE) ×2 IMPLANT
TOWEL OR NON WOVEN STRL DISP B (DISPOSABLE) ×2 IMPLANT
TUBING CONNECTING 10 (TUBING) ×2 IMPLANT

## 2021-03-30 NOTE — Interval H&P Note (Signed)
History and Physical Interval Note:  03/30/2021 9:04 AM  Erica Coleman  has presented today for surgery, with the diagnosis of MULTINODULER GOITER WITH HYPERTHYROIDISIM.  The various methods of treatment have been discussed with the patient and family. After consideration of risks, benefits and other options for treatment, the patient has consented to    Procedure(s): TOTAL THYROIDECTOMY (N/A) as a surgical intervention.    The patient's history has been reviewed, patient examined, no change in status, stable for surgery.  I have reviewed the patient's chart and labs.  Questions were answered to the patient's satisfaction.    Darnell Level, MD [facility] Office: [phone]   Darnell Level

## 2021-03-30 NOTE — Anesthesia Postprocedure Evaluation (Signed)
Anesthesia Post Note  Patient: Erica Coleman  Procedure(s) Performed: TOTAL THYROIDECTOMY (N/A Neck)     Patient location during evaluation: PACU Anesthesia Type: General Level of consciousness: awake Pain management: pain level controlled Vital Signs Assessment: post-procedure vital signs reviewed and stable Respiratory status: spontaneous breathing, nonlabored ventilation, respiratory function stable and patient connected to nasal cannula oxygen Cardiovascular status: blood pressure returned to baseline and stable Postop Assessment: no apparent nausea or vomiting Anesthetic complications: no   No complications documented.  Last Vitals:  Vitals:   03/30/21 1528 03/30/21 1636  BP: (!) 152/102 (!) 161/91  Pulse: 93 88  Resp: 18 18  Temp: 36.6 C 36.4 C  SpO2: 100% 100%    Last Pain:  Vitals:   03/30/21 1636  TempSrc: Oral  PainSc:                  Matheson Vandehei P Chakia Counts

## 2021-03-30 NOTE — Transfer of Care (Signed)
Immediate Anesthesia Transfer of Care Note  Patient: Erica Coleman  Procedure(s) Performed: TOTAL THYROIDECTOMY (N/A Neck)  Patient Location: PACU  Anesthesia Type:General  Level of Consciousness: awake, alert  and oriented  Airway & Oxygen Therapy: Patient Spontanous Breathing and Patient connected to face mask oxygen  Post-op Assessment: Report given to RN and Post -op Vital signs reviewed and stable  Post vital signs: Reviewed and stable  Last Vitals:  Vitals Value Taken Time  BP    Temp    Pulse 109 03/30/21 1216  Resp 15 03/30/21 1216  SpO2 100 % 03/30/21 1216  Vitals shown include unvalidated device data.  Last Pain:  Vitals:   03/30/21 0822  TempSrc: Oral  PainSc:          Complications: No complications documented.

## 2021-03-30 NOTE — Op Note (Signed)
Procedure Note  Pre-operative Diagnosis:  Toxic multinodular goiter  Post-operative Diagnosis:  same  Surgeon:  Darnell Level, MD  Assistant:  Derrick Ravel, MD ([facility])   Procedure:  Total thyroidectomy  Anesthesia:  General  Estimated Blood Loss:  minimal  Drains: none         Specimen: thyroid to pathology  Indications:  Patient is referred by Dr. Debara Pickett for consideration of thyroidectomy for management of multinodular thyroid goiter with mild hyperthyroidism. Patient has had a long-standing multinodular thyroid goiter. There is a family history of goiter in the patient's father and paternal grandmother. Recently the patient is noted an increase in size. She is also had fluctuations in her TSH level. Her most recent level was 0.36 in May 2021. Patient has never been on thyroid medications or anti-thyroid medications. She has been taking supplements. She has experienced hot flashes. She has had no prior head or neck surgery. 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 Ultrasound demonstrates a markedly enlarged thyroid gland which is asymmetric with the left lobe measuring nearly 9 cm in size in the right lobe measuring 7 cm in size. There are bilateral thyroid nodules. She is undergone previous fine-needle aspiration biopsy in 2015 which was benign. She presents today to discuss total thyroidectomy for management of multinodular goiter and borderline hyperthyroidism.  Procedure Details: Procedure was done in OR #1 at the [facility]. The patient was brought to the operating room and placed in a supine position on the operating room table. Following administration of general anesthesia, the patient was positioned and then prepped and draped in the usual aseptic fashion. After ascertaining that an adequate level of anesthesia had been achieved, a Kocher incision was made with #15 blade. Dissection was carried through subcutaneous tissues and  platysma.Hemostasis was achieved with the electrocautery. Skin flaps were elevated cephalad and caudad from the thyroid notch to the sternal notch. A Mahorner self-retaining retractor was placed for exposure. Strap muscles were incised in the midline and dissection was begun on the left side.  Strap muscles were reflected laterally.  Left thyroid lobe was massively enlarged and multinodular.  The left lobe was gently mobilized with blunt dissection. Superior pole vessels were dissected out and divided individually between small and medium ligaclips with the harmonic scalpel. The thyroid lobe was rolled anteriorly. Branches of the inferior thyroid artery were divided between ligaclips with the harmonic scalpel. Inferior venous tributaries were divided between ligaclips. Both the superior parathyroid gland was identified and preserved on its vascular pedicle. The recurrent laryngeal nerve was avoided along its course. The ligament of Allyson Sabal was released with the electrocautery and the gland was mobilized onto the anterior trachea. Isthmus was mobilized across the midline. There was no significant pyramidal lobe present. Dry pack was placed in the left neck.  The right thyroid lobe was gently mobilized with blunt dissection. Right thyroid lobe was moderately enlarged and multinodular. Superior pole vessels were dissected out and divided between small and medium ligaclips with the Harmonic scalpel. Superior parathyroid was identified and preserved. Inferior venous tributaries were divided between medium ligaclips with the harmonic scalpel. The right thyroid lobe was rolled anteriorly and the branches of the inferior thyroid artery divided between small ligaclips.  The inferior thyroid artery branch on the trachea at the ligament of Allyson Sabal was suture ligated with a 3-0 silk suture. The right recurrent laryngeal nerve was identified and preserved along its course. The ligament of Allyson Sabal was released with the  electrocautery. The right thyroid lobe was mobilized onto the anterior trachea and the remainder of the thyroid was dissected off the anterior trachea and the thyroid was completely excised. A suture was used to mark the left lobe. The entire thyroid gland was submitted to pathology for review.  The neck was irrigated with warm saline. Fibrillar was placed throughout the operative field. Strap muscles were approximated in the midline with interrupted 3-0 Vicryl sutures. Platysma was closed with interrupted 3-0 Vicryl sutures. Skin was closed with a running 4-0 Monocryl subcuticular suture. Wound was washed and Dermabond was applied. The patient was awakened from anesthesia and brought to the recovery room. The patient tolerated the procedure well.   Darnell Level, MD [facility] Office: [phone]

## 2021-03-30 NOTE — Anesthesia Procedure Notes (Signed)
Procedure Name: Intubation Date/Time: 03/30/2021 9:38 AM Performed by: Maxwell Caul, CRNA Pre-anesthesia Checklist: Patient identified, Emergency Drugs available, Suction available and Patient being monitored Patient Re-evaluated:Patient Re-evaluated prior to induction Oxygen Delivery Method: Circle system utilized Preoxygenation: Pre-oxygenation with 100% oxygen Induction Type: IV induction Ventilation: Mask ventilation without difficulty Laryngoscope Size: Mac and 4 Grade View: Grade II Tube type: Oral Tube size: 7.5 mm Number of attempts: 1 Airway Equipment and Method: Stylet Placement Confirmation: ETT inserted through vocal cords under direct vision,  positive ETCO2 and breath sounds checked- equal and bilateral Secured at: 21 cm Tube secured with: Tape Dental Injury: Teeth and Oropharynx as per pre-operative assessment

## 2021-03-30 NOTE — Anesthesia Preprocedure Evaluation (Addendum)
Anesthesia Evaluation  Patient identified by MRN, date of birth, ID band Patient awake    Reviewed: Allergy & Precautions, NPO status , Patient's Chart, lab work & pertinent test results  Airway Mallampati: III  TM Distance: >3 FB Neck ROM: Full    Dental no notable dental hx.    Pulmonary former smoker,    Pulmonary exam normal breath sounds clear to auscultation       Cardiovascular hypertension, Normal cardiovascular exam Rhythm:Regular Rate:Normal  ECG: NSR, rate 81   Neuro/Psych negative neurological ROS  negative psych ROS   GI/Hepatic negative GI ROS, Neg liver ROS,   Endo/Other  diabetes (PRE)  Renal/GU negative Renal ROS     Musculoskeletal negative musculoskeletal ROS (+)   Abdominal   Peds  Hematology negative hematology ROS (+)   Anesthesia Other Findings MULTINODULER GOITER WITH HYPERTHYROIDISIM  Reproductive/Obstetrics                            Anesthesia Physical Anesthesia Plan  ASA: II  Anesthesia Plan: General   Post-op Pain Management:    Induction: Intravenous  PONV Risk Score and Plan: 4 or greater and Ondansetron, Dexamethasone, Midazolam, Scopolamine patch - Pre-op and Treatment may vary due to age or medical condition  Airway Management Planned: Oral ETT  Additional Equipment:   Intra-op Plan:   Post-operative Plan: Extubation in OR  Informed Consent: I have reviewed the patients History and Physical, chart, labs and discussed the procedure including the risks, benefits and alternatives for the proposed anesthesia with the patient or authorized representative who has indicated his/her understanding and acceptance.     Dental advisory given  Plan Discussed with: CRNA  Anesthesia Plan Comments:        Anesthesia Quick Evaluation

## 2021-03-31 ENCOUNTER — Encounter (HOSPITAL_COMMUNITY): Payer: Self-pay | Admitting: Surgery

## 2021-03-31 ENCOUNTER — Other Ambulatory Visit (HOSPITAL_COMMUNITY): Payer: Self-pay

## 2021-03-31 DIAGNOSIS — Z8042 Family history of malignant neoplasm of prostate: Secondary | ICD-10-CM | POA: Diagnosis not present

## 2021-03-31 DIAGNOSIS — E0520 Thyrotoxicosis with toxic multinodular goiter without thyrotoxic crisis or storm: Secondary | ICD-10-CM | POA: Diagnosis not present

## 2021-03-31 DIAGNOSIS — I10 Essential (primary) hypertension: Secondary | ICD-10-CM | POA: Diagnosis not present

## 2021-03-31 DIAGNOSIS — Z833 Family history of diabetes mellitus: Secondary | ICD-10-CM | POA: Diagnosis not present

## 2021-03-31 DIAGNOSIS — Z841 Family history of disorders of kidney and ureter: Secondary | ICD-10-CM | POA: Diagnosis not present

## 2021-03-31 DIAGNOSIS — E119 Type 2 diabetes mellitus without complications: Secondary | ICD-10-CM | POA: Diagnosis not present

## 2021-03-31 DIAGNOSIS — Z87891 Personal history of nicotine dependence: Secondary | ICD-10-CM | POA: Diagnosis not present

## 2021-03-31 DIAGNOSIS — Z8349 Family history of other endocrine, nutritional and metabolic diseases: Secondary | ICD-10-CM | POA: Diagnosis not present

## 2021-03-31 DIAGNOSIS — Z803 Family history of malignant neoplasm of breast: Secondary | ICD-10-CM | POA: Diagnosis not present

## 2021-03-31 LAB — BASIC METABOLIC PANEL
Anion gap: 8 (ref 5–15)
BUN: 9 mg/dL (ref 6–20)
CO2: 26 mmol/L (ref 22–32)
Calcium: 9 mg/dL (ref 8.9–10.3)
Chloride: 103 mmol/L (ref 98–111)
Creatinine, Ser: 0.73 mg/dL (ref 0.44–1.00)
GFR, Estimated: >60 mL/min (ref >60)
Glucose, Bld: 133 mg/dL — ABNORMAL HIGH (ref 70–99)
Potassium: 4.1 mmol/L (ref 3.5–5.1)
Sodium: 137 mmol/L (ref 135–145)

## 2021-03-31 MED ORDER — CALCIUM CARBONATE ANTACID 500 MG PO CHEW
2.0000 | CHEWABLE_TABLET | Freq: Two times a day (BID) | ORAL | 1 refills | Status: DC
  Filled 2021-03-31: qty 90, 23d supply, fill #0

## 2021-03-31 MED ORDER — LEVOTHYROXINE SODIUM 100 MCG PO TABS
100.0000 ug | ORAL_TABLET | Freq: Every day | ORAL | 2 refills | Status: DC
  Filled 2021-03-31: qty 30, 30d supply, fill #0
  Filled 2021-04-27: qty 30, 30d supply, fill #1
  Filled 2021-05-29: qty 30, 30d supply, fill #2

## 2021-03-31 NOTE — Discharge Instructions (Signed)
[facility]  THYROID & PARATHYROID SURGERY:  POST-OP INSTRUCTIONS  Always review your discharge instruction sheet from the facility where your surgery was performed.  A prescription for pain medication may be given to you upon discharge.  Take your pain medication as prescribed.  If narcotic pain medicine is not needed, then you may take acetaminophen (Tylenol) or ibuprofen (Advil) as needed.  Take your usually prescribed medications unless otherwise directed.  If you need a refill on your pain medication, please contact our office during regular business hours.  Prescriptions cannot be processed by our office after 5 pm or on weekends.  Start with a light diet upon arrival home, such as soup and crackers or toast.  Be sure to drink plenty of fluids daily.  Resume your normal diet the day after surgery.  Most patients will experience some swelling and bruising on the chest and neck area.  Ice packs will help.  Swelling and bruising can take several days to resolve.   It is common to experience some constipation after surgery.  Increasing fluid intake and taking a stool softener (Colace) will usually help or prevent this problem.  A mild laxative (Milk of Magnesia or Miralax) should be taken according to package directions if there has been no bowel movement after 48 hours.  You have steri-strips and a gauze dressing over your incision.  You may remove the gauze bandage on the second day after surgery, and you may shower at that time.  Leave your steri-strips (small skin tapes) in place directly over the incision.  These strips should remain on the skin for 5-7 days and then be removed.  You may get them wet in the shower and pat them dry.  You may resume regular (light) daily activities beginning the next day (such as daily self-care, walking, climbing stairs) gradually increasing activities as tolerated.  You may have sexual intercourse when it is comfortable.  Refrain from  any heavy lifting or straining until approved by your doctor.  You may drive when you no longer are taking prescription pain medication, you can comfortably wear a seatbelt, and you can safely maneuver your car and apply brakes.  You should see your doctor in the office for a follow-up appointment approximately three weeks after your surgery.  Make sure that you call for this appointment within a day or two after you arrive home to insure a convenient appointment time.  WHEN TO CALL YOUR DOCTOR: -- Fever greater than 101.5 -- Inability to urinate -- Nausea and/or vomiting - persistent -- Extreme swelling or bruising -- Continued bleeding from incision -- Increased pain, redness, or drainage from the incision -- Difficulty swallowing or breathing -- Muscle cramping or spasms -- Numbness or tingling in hands or around lips  The clinic staff is available to answer your questions during regular business hours.  Please don't hesitate to call and ask to speak to one of the nurses if you have concerns.  Taiyo Kozma, MD [facility] Office: [phone] 

## 2021-03-31 NOTE — Progress Notes (Signed)
Assessment unchanged. Pt verbalized understanding of dc instructions through teach back. Discharge to front entrance accompanied by NT.

## 2021-03-31 NOTE — Discharge Summary (Signed)
Physician Discharge Summary [facility]  Patient ID: Erica Coleman MRN: [id number] DOB/AGE: 1981/08/12 40 y.o.  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  Post op course was uncomplicated.  Pain was well controlled.  Tolerated diet.  Post op calcium level on morning following surgery was 9.0 mg/dl.  Patient was prepared for discharge home on POD#1.  Consults: None  Treatments: surgery: total thyroidectomy  Discharge Exam: Blood pressure 139/78, pulse 99, temperature 98 F (36.7 C), temperature source Oral, resp. rate 18, height 5\' 2"  (1.575 m), weight 71.2 kg, SpO2 98 %. HEENT - clear Neck - wound dry and intact; mild STS; voice normal; Dermabond in place Chest - clear bilaterally Cor - RRR  Disposition: Home  Discharge Instructions    Diet - low sodium heart healthy   Complete by: As directed    Increase activity slowly   Complete by: As directed    No dressing needed   Complete by: As directed      Allergies as of 03/31/2021      Reactions   Sulfa Antibiotics Other (See Comments), Anxiety   Other reaction(s): Agitation sweating      Medication List    TAKE these medications   ACIDOPHILUS LACTOBACILLUS PO Take 1 capsule by mouth daily.   Aczone 7.5 % Gel Generic drug: Dapsone Apply 1 application topically every morning.   Dapsone 7.5 % Gel APPLY TO AFFECTED AREAS ON FACE DAILY AS DIRECTED   amphetamine-dextroamphetamine 20 MG tablet Commonly known as: ADDERALL Take 1 tablet in the morning and 1/2 tablet in the afternoon What changed: Another medication with the same name was changed. Make sure you understand how and when to take each.   amphetamine-dextroamphetamine  20 MG tablet Commonly known as: ADDERALL Take 1 tablet in morning and 1/2 tablet in the afternoon What changed:   how much to take  how to take this  when to take this  additional instructions   amphetamine-dextroamphetamine 20 MG tablet Commonly known as: ADDERALL Take 1 tablet in morning and 1/2 tablet in the afternoon (fill 04/18/21) Start taking on: April 18, 2021 What changed: Another medication with the same name was changed. Make sure you understand how and when to take each.   amphetamine-dextroamphetamine 20 MG tablet Commonly known as: ADDERALL Take 1 tablet in morning  and 1/2 tablet in the afternoon (fill 05/16/21) Start taking on: May 16, 2021 What changed: Another medication with the same name was changed. Make sure you understand how and when to take each.   Ashwagandha 500 MG Caps Take 500 mg by mouth daily.   calcium carbonate 500 MG chewable tablet Commonly known as: Tums Chew 2 tablets (400 mg of elemental calcium total) by mouth 2 (two) times daily.   cetirizine 5 MG tablet Commonly known as: ZYRTEC Take 10 mg by mouth in the morning and at bedtime.   Cholecalciferol 50 MCG (2000 UT) Tabs Take 2,000 Units by mouth daily.   clindamycin-benzoyl peroxide gel Commonly known as: BENZACLIN APPLY TO THE AFFECTED AREA(S) ON FACE IN THE MORNING AS DIRECTED What changed:   how much to take  how to take this  when to take this   Garlic 1000 MG Caps Take 1,000 mg by  mouth daily.   levothyroxine 100 MCG tablet Commonly known as: Synthroid Take 1 tablet (100 mcg total) by mouth daily before breakfast.   Nexplanon 68 MG Impl implant Generic drug: etonogestrel 1 each by Subdermal route once. Left arm   Omega 3 1000 MG Caps Take 1,000 mg by mouth daily.   PREGNENOLONE PO Take 75 mg by mouth daily.   THYROID PO Take 1 capsule by mouth daily.   vitamin C 500 MG tablet Commonly known as: ASCORBIC ACID Take 500 mg by mouth daily.   Vitamin K2 100 MCG  Tabs Take 100 mcg by mouth daily.            Discharge Care Instructions  (From admission, onward)         Start     Ordered   03/31/21 0000  No dressing needed        03/31/21 1219          Follow-up Information    Darnell Level, MD. Schedule an appointment as soon as possible for a visit in 3 week(s).   Specialty: General Surgery Contact information: [address] [place] [address] [phone]               Darnell Level, MD [facility] Office: [phone]   Signed: Darnell Level 03/31/2021, 7:47 AM

## 2021-04-02 LAB — SURGICAL PATHOLOGY

## 2021-04-03 NOTE — Progress Notes (Signed)
Please contact patient and notify of benign pathology results.  Daley Gosse M. Docie Abramovich, MD, FACS [facility] Office: [phone]   

## 2021-04-20 ENCOUNTER — Other Ambulatory Visit (HOSPITAL_COMMUNITY): Payer: Self-pay

## 2021-04-24 DIAGNOSIS — E890 Postprocedural hypothyroidism: Secondary | ICD-10-CM | POA: Diagnosis not present

## 2021-04-27 ENCOUNTER — Other Ambulatory Visit (HOSPITAL_COMMUNITY): Payer: Self-pay

## 2021-05-29 ENCOUNTER — Other Ambulatory Visit (HOSPITAL_COMMUNITY): Payer: Self-pay

## 2021-06-09 ENCOUNTER — Other Ambulatory Visit (HOSPITAL_COMMUNITY): Payer: Self-pay

## 2021-06-30 ENCOUNTER — Other Ambulatory Visit (HOSPITAL_COMMUNITY): Payer: Self-pay

## 2021-07-02 ENCOUNTER — Other Ambulatory Visit (HOSPITAL_COMMUNITY): Payer: Self-pay

## 2021-07-03 ENCOUNTER — Other Ambulatory Visit (HOSPITAL_COMMUNITY): Payer: Self-pay

## 2021-07-03 MED ORDER — LEVOTHYROXINE SODIUM 100 MCG PO TABS
ORAL_TABLET | ORAL | 2 refills | Status: DC
  Filled 2021-07-03: qty 30, 30d supply, fill #0
  Filled 2021-08-02: qty 30, 30d supply, fill #1
  Filled 2021-09-02: qty 30, 30d supply, fill #2

## 2021-08-02 ENCOUNTER — Other Ambulatory Visit (HOSPITAL_COMMUNITY): Payer: Self-pay

## 2021-09-03 ENCOUNTER — Other Ambulatory Visit (HOSPITAL_COMMUNITY): Payer: Self-pay

## 2021-09-25 ENCOUNTER — Other Ambulatory Visit (HOSPITAL_COMMUNITY): Payer: Self-pay

## 2021-09-27 ENCOUNTER — Other Ambulatory Visit (HOSPITAL_COMMUNITY): Payer: Self-pay

## 2021-09-27 MED ORDER — AMPHETAMINE-DEXTROAMPHETAMINE 20 MG PO TABS
ORAL_TABLET | ORAL | 0 refills | Status: DC
  Filled 2021-09-27: qty 45, 30d supply, fill #0

## 2021-10-01 ENCOUNTER — Other Ambulatory Visit (HOSPITAL_COMMUNITY): Payer: Self-pay

## 2021-10-01 MED ORDER — AMPHETAMINE-DEXTROAMPHETAMINE 20 MG PO TABS
ORAL_TABLET | ORAL | 0 refills | Status: DC
  Filled 2021-10-01 – 2021-11-12 (×2): qty 45, 30d supply, fill #0

## 2021-10-04 ENCOUNTER — Other Ambulatory Visit (HOSPITAL_COMMUNITY): Payer: Self-pay

## 2021-10-08 ENCOUNTER — Other Ambulatory Visit (HOSPITAL_COMMUNITY): Payer: Self-pay

## 2021-10-10 ENCOUNTER — Other Ambulatory Visit (HOSPITAL_COMMUNITY): Payer: Self-pay

## 2021-10-10 MED ORDER — LEVOTHYROXINE SODIUM 100 MCG PO TABS
ORAL_TABLET | ORAL | 0 refills | Status: DC
  Filled 2021-10-10: qty 30, 30d supply, fill #0

## 2021-11-09 ENCOUNTER — Other Ambulatory Visit (HOSPITAL_COMMUNITY): Payer: Self-pay

## 2021-11-09 MED ORDER — LEVOTHYROXINE SODIUM 100 MCG PO TABS
100.0000 ug | ORAL_TABLET | Freq: Every day | ORAL | 0 refills | Status: DC
  Filled 2021-11-09: qty 30, 30d supply, fill #0

## 2021-11-12 ENCOUNTER — Other Ambulatory Visit (HOSPITAL_COMMUNITY): Payer: Self-pay

## 2021-11-17 ENCOUNTER — Other Ambulatory Visit (HOSPITAL_COMMUNITY): Payer: Self-pay

## 2021-12-10 ENCOUNTER — Other Ambulatory Visit (HOSPITAL_COMMUNITY): Payer: Self-pay

## 2021-12-10 MED ORDER — LEVOTHYROXINE SODIUM 100 MCG PO TABS
ORAL_TABLET | ORAL | 2 refills | Status: DC
  Filled 2021-12-10: qty 30, 30d supply, fill #0
  Filled 2022-01-07: qty 30, 30d supply, fill #1
  Filled 2022-02-13: qty 30, 30d supply, fill #2

## 2021-12-24 ENCOUNTER — Ambulatory Visit: Payer: 59 | Admitting: Family Medicine

## 2022-01-07 ENCOUNTER — Other Ambulatory Visit (HOSPITAL_COMMUNITY): Payer: Self-pay

## 2022-01-11 ENCOUNTER — Other Ambulatory Visit (HOSPITAL_COMMUNITY): Payer: Self-pay

## 2022-01-12 ENCOUNTER — Other Ambulatory Visit (HOSPITAL_COMMUNITY): Payer: Self-pay

## 2022-01-17 ENCOUNTER — Other Ambulatory Visit (HOSPITAL_COMMUNITY): Payer: Self-pay

## 2022-01-21 ENCOUNTER — Other Ambulatory Visit (HOSPITAL_COMMUNITY): Payer: Self-pay

## 2022-01-21 MED ORDER — AMPHETAMINE-DEXTROAMPHETAMINE 20 MG PO TABS
ORAL_TABLET | ORAL | 0 refills | Status: AC
  Filled 2022-01-21 – 2022-03-20 (×2): qty 45, 30d supply, fill #0

## 2022-02-05 ENCOUNTER — Other Ambulatory Visit (HOSPITAL_COMMUNITY): Payer: Self-pay

## 2022-02-05 DIAGNOSIS — F900 Attention-deficit hyperactivity disorder, predominantly inattentive type: Secondary | ICD-10-CM | POA: Diagnosis not present

## 2022-02-05 MED ORDER — VYVANSE 40 MG PO CAPS
ORAL_CAPSULE | ORAL | 0 refills | Status: DC
  Filled 2022-02-05: qty 30, 30d supply, fill #0

## 2022-02-13 ENCOUNTER — Other Ambulatory Visit (HOSPITAL_COMMUNITY): Payer: Self-pay

## 2022-03-05 ENCOUNTER — Ambulatory Visit: Payer: 59 | Admitting: Family Medicine

## 2022-03-14 ENCOUNTER — Other Ambulatory Visit (HOSPITAL_COMMUNITY): Payer: Self-pay

## 2022-03-14 MED ORDER — LEVOTHYROXINE SODIUM 100 MCG PO TABS
ORAL_TABLET | ORAL | 1 refills | Status: DC
  Filled 2022-03-14: qty 30, 30d supply, fill #0
  Filled 2022-04-15: qty 30, 30d supply, fill #1

## 2022-03-20 ENCOUNTER — Other Ambulatory Visit (HOSPITAL_COMMUNITY): Payer: Self-pay

## 2022-03-28 ENCOUNTER — Other Ambulatory Visit (HOSPITAL_COMMUNITY): Payer: Self-pay

## 2022-03-28 DIAGNOSIS — L700 Acne vulgaris: Secondary | ICD-10-CM | POA: Diagnosis not present

## 2022-03-28 MED ORDER — TRETINOIN 0.025 % EX CREA
TOPICAL_CREAM | CUTANEOUS | 2 refills | Status: AC
  Filled 2022-03-28 – 2022-04-09 (×2): qty 20, 30d supply, fill #0

## 2022-03-28 MED ORDER — CLINDAMYCIN PHOS-BENZOYL PEROX 1-5 % EX GEL
CUTANEOUS | 2 refills | Status: AC
  Filled 2022-03-28 – 2022-07-09 (×4): qty 50, 30d supply, fill #0

## 2022-03-28 MED ORDER — DAPSONE 7.5 % EX GEL
CUTANEOUS | 2 refills | Status: AC
  Filled 2022-03-28: qty 60, 30d supply, fill #0

## 2022-03-29 ENCOUNTER — Other Ambulatory Visit (HOSPITAL_COMMUNITY): Payer: Self-pay

## 2022-04-01 ENCOUNTER — Other Ambulatory Visit (HOSPITAL_COMMUNITY): Payer: Self-pay

## 2022-04-01 MED ORDER — CLINDAMYCIN PHOS-BENZOYL PEROX 1.2-5 % EX GEL
CUTANEOUS | 2 refills | Status: AC
  Filled 2022-04-01: qty 45, 30d supply, fill #0
  Filled 2022-06-21 – 2022-07-09 (×2): qty 45, 30d supply, fill #1

## 2022-04-02 ENCOUNTER — Other Ambulatory Visit (HOSPITAL_COMMUNITY): Payer: Self-pay

## 2022-04-09 ENCOUNTER — Other Ambulatory Visit (HOSPITAL_COMMUNITY): Payer: Self-pay

## 2022-04-15 ENCOUNTER — Other Ambulatory Visit (HOSPITAL_COMMUNITY): Payer: Self-pay

## 2022-04-16 ENCOUNTER — Other Ambulatory Visit (HOSPITAL_COMMUNITY): Payer: Self-pay

## 2022-04-18 ENCOUNTER — Other Ambulatory Visit (HOSPITAL_COMMUNITY): Payer: Self-pay

## 2022-04-19 ENCOUNTER — Other Ambulatory Visit (HOSPITAL_COMMUNITY): Payer: Self-pay

## 2022-04-20 ENCOUNTER — Other Ambulatory Visit (HOSPITAL_COMMUNITY): Payer: Self-pay

## 2022-04-24 ENCOUNTER — Other Ambulatory Visit (HOSPITAL_COMMUNITY): Payer: Self-pay

## 2022-04-27 ENCOUNTER — Other Ambulatory Visit (HOSPITAL_COMMUNITY): Payer: Self-pay

## 2022-04-30 ENCOUNTER — Ambulatory Visit (INDEPENDENT_AMBULATORY_CARE_PROVIDER_SITE_OTHER): Payer: 59 | Admitting: Family Medicine

## 2022-04-30 ENCOUNTER — Encounter: Payer: Self-pay | Admitting: Family Medicine

## 2022-04-30 VITALS — BP 135/85 | HR 90 | Ht 62.0 in | Wt 161.0 lb

## 2022-04-30 DIAGNOSIS — E119 Type 2 diabetes mellitus without complications: Secondary | ICD-10-CM

## 2022-04-30 DIAGNOSIS — Z13220 Encounter for screening for lipoid disorders: Secondary | ICD-10-CM

## 2022-04-30 DIAGNOSIS — E890 Postprocedural hypothyroidism: Secondary | ICD-10-CM

## 2022-04-30 DIAGNOSIS — Z0000 Encounter for general adult medical examination without abnormal findings: Secondary | ICD-10-CM | POA: Diagnosis not present

## 2022-04-30 NOTE — Assessment & Plan Note (Signed)
She is taking levothyroxine and feels pretty well on the current dose.  Updating TSH today.

## 2022-04-30 NOTE — Assessment & Plan Note (Addendum)
Updating A1c today. ?

## 2022-04-30 NOTE — Patient Instructions (Signed)
Great to see you today! We'll be in touch with lab results and recommendations.  You can schedule with our nurse practitioner, Joy, to have nexplanon replaced.

## 2022-04-30 NOTE — Progress Notes (Signed)
Erica Coleman - 42 y.o. female MRN [id number]  Date of birth: 03-Apr-1981  Subjective No chief complaint on file.   HPI Erica Coleman is a 41 y.o. female she is here today for initial visit to establish care and annual exam.  She has not had a primary care doctor in a while.  She was seeing integrative medicine for you..  But has not seen them in several months.  She did have a thyroidectomy last year for toxic goiter.  She has not had any follow-up on her thyroid since surgery.  Reports that she feels okay with current dose of levothyroxine.  Integrative medicine did have her on pregnenolone and a thyroid supplement however she is not currently taking this.  She is not currently taking medication for her blood pressure.  Previously on lisinopril with hydrochlorothiazide.  Blood pressure has been well controlled without medication.  She does have a prior history of type 2 diabetes.  Was on metformin in [place] but not currently taking.  Denies any symptoms related to diabetes at this time.  She is seeing a psychiatrist for management of Adderall.  Doing well with this at this time.  Denies smoking at this time.  Limited alcohol use.  Her job keeps her pretty active.  Feels like diet has been okay.  Review of Systems  Constitutional:  Negative for chills, fever, malaise/fatigue and weight loss.  HENT:  Negative for congestion, ear pain and sore throat.   Eyes:  Negative for blurred vision, double vision and pain.  Respiratory:  Negative for cough and shortness of breath.   Cardiovascular:  Negative for chest pain and palpitations.  Gastrointestinal:  Negative for abdominal pain, blood in stool, constipation, heartburn and nausea.  Genitourinary:  Negative for dysuria and urgency.  Musculoskeletal:  Negative for joint pain and myalgias.  Neurological:  Negative for dizziness and headaches.  Endo/Heme/Allergies:  Does not bruise/bleed easily.  Psychiatric/Behavioral:  Negative for  depression. The patient is not nervous/anxious and does not have insomnia.      ROS:  A comprehensive ROS was completed and negative except as noted per HPI   Allergies  Allergen Reactions   Sulfa Antibiotics Other (See Comments) and Anxiety    Other reaction(s): Agitation sweating    Past Medical History:  Diagnosis Date   Heart murmur    as a child   Hypertension    no meds  now   Pre-diabetes    diet controlled    Past Surgical History:  Procedure Laterality Date   THYROIDECTOMY N/A 03/30/2021   Procedure: TOTAL THYROIDECTOMY;  Surgeon: Darnell Level, MD;  Location: [facility];  Service: General;  Laterality: N/A;   WISDOM TOOTH EXTRACTION      Social History   Socioeconomic History   Marital status: Single    Spouse name: Not on file   Number of children: Not on file   Years of education: Not on file   Highest education level: Not on file  Occupational History   Not on file  Tobacco Use   Smoking status: Former    Packs/day: 0.50    Years: 10.00    Total pack years: 5.00    Types: Cigarettes    Quit date: 2015    Years since quitting: 8.4   Smokeless tobacco: Never  Vaping Use   Vaping Use: Former   Start date: 12/16/2013   Quit date: 03/20/2017   Substances: Nicotine, Flavoring  Substance and Sexual Activity   Alcohol  use: Yes    Comment: occ/socially    Drug use: Never   Sexual activity: Not on file  Other Topics Concern   Not on file  Social History Narrative   Not on file   Social Determinants of Health   Financial Resource Strain: Not on file  Food Insecurity: Not on file  Transportation Needs: Not on file  Physical Activity: Not on file  Stress: Not on file  Social Connections: Not on file    No family history on file.  Health Maintenance  Topic Date Due   HEMOGLOBIN A1C  Never done   COVID-19 Vaccine (1) Never done   FOOT EXAM  Never done   OPHTHALMOLOGY EXAM  Never done   URINE MICROALBUMIN  Never done   HIV Screening  Never done    Hepatitis C Screening  Never done   TETANUS/TDAP  Never done   PAP SMEAR-Modifier  Never done   INFLUENZA VACCINE  06/11/2022   HPV VACCINES  Aged Out     ----------------------------------------------------------------------------------------------------------------------------------------------------------------------------------------------------------------- Physical Exam BP 135/85 (BP Location: Left Arm, Patient Position: Sitting, Cuff Size: Normal)   Pulse 90   Ht 5\' 2"  (1.575 m)   Wt 161 lb (73 kg)   SpO2 100%   BMI 29.45 kg/m   Physical Exam Constitutional:      General: She is not in acute distress.    Appearance: Normal appearance.  HENT:     Head: Normocephalic and atraumatic.     Right Ear: Tympanic membrane and ear canal normal.     Left Ear: Tympanic membrane and ear canal normal.     Nose: Nose normal.  Eyes:     General: No scleral icterus.    Conjunctiva/sclera: Conjunctivae normal.  Neck:     Thyroid: No thyromegaly.  Cardiovascular:     Rate and Rhythm: Normal rate and regular rhythm.     Heart sounds: Normal heart sounds.  Pulmonary:     Effort: Pulmonary effort is normal.     Breath sounds: Normal breath sounds.  Abdominal:     General: Bowel sounds are normal. There is no distension.     Palpations: Abdomen is soft.     Tenderness: There is no abdominal tenderness. There is no guarding.  Musculoskeletal:        General: Normal range of motion.     Cervical back: Normal range of motion and neck supple.  Lymphadenopathy:     Cervical: No cervical adenopathy.  Skin:    General: Skin is warm and dry.     Findings: No rash.  Neurological:     General: No focal deficit present.     Mental Status: She is alert and oriented to person, place, and time.     Cranial Nerves: No cranial nerve deficit.     Coordination: Coordination normal.  Psychiatric:        Mood and Affect: Mood normal.        Behavior: Behavior normal.      ------------------------------------------------------------------------------------------------------------------------------------------------------------------------------------------------------------------- Assessment and Plan  Postoperative hypothyroidism She is taking levothyroxine and feels pretty well on the current dose.  Updating TSH today.  Type 2 diabetes mellitus without complication (HCC) Updating A1c today.  Well adult exam Well adult Orders Placed This Encounter  Procedures   COMPLETE METABOLIC PANEL WITH GFR   CBC with Differential   TSH + free T4   Lipid Panel w/reflex Direct LDL   HgB A1c  Screenings: Per lab orders Immunizations currently up-to-date Anticipatory  guidance/risk factor reduction: Recommendations per AVS  We will plan to have her schedule with Joy for Nexplanon replacement   No orders of the defined types were placed in this encounter.   No follow-ups on file.    This visit occurred during the SARS-CoV-2 public health emergency.  Safety protocols were in place, including screening questions prior to the visit, additional usage of staff PPE, and extensive cleaning of exam room while observing appropriate contact time as indicated for disinfecting solutions.

## 2022-04-30 NOTE — Assessment & Plan Note (Addendum)
Well adult Orders Placed This Encounter  Procedures  . COMPLETE METABOLIC PANEL WITH GFR  . CBC with Differential  . TSH + free T4  . Lipid Panel w/reflex Direct LDL  . HgB A1c  Screenings: Per lab orders Immunizations currently up-to-date Anticipatory guidance/risk factor reduction: Recommendations per AVS  We will plan to have her schedule with Joy for Nexplanon replacement

## 2022-05-01 LAB — CBC WITH DIFFERENTIAL/PLATELET
Absolute Monocytes: 448 cells/uL (ref 200–950)
Basophils Absolute: 23 cells/uL (ref 0–200)
Basophils Relative: 0.3 %
Eosinophils Absolute: 38 cells/uL (ref 15–500)
Eosinophils Relative: 0.5 %
HCT: 41.1 % (ref 35.0–45.0)
Hemoglobin: 13.7 g/dL (ref 11.7–15.5)
Lymphs Abs: 2660 cells/uL (ref 850–3900)
MCH: 29.1 pg (ref 27.0–33.0)
MCHC: 33.3 g/dL (ref 32.0–36.0)
MCV: 87.3 fL (ref 80.0–100.0)
MPV: 10 fL (ref 7.5–12.5)
Monocytes Relative: 5.9 %
Neutro Abs: 4431 cells/uL (ref 1500–7800)
Neutrophils Relative %: 58.3 %
Platelets: 352 10*3/uL (ref 140–400)
RBC: 4.71 10*6/uL (ref 3.80–5.10)
RDW: 13.1 % (ref 11.0–15.0)
Total Lymphocyte: 35 %
WBC: 7.6 10*3/uL (ref 3.8–10.8)

## 2022-05-01 LAB — COMPLETE METABOLIC PANEL WITH GFR
AG Ratio: 1.5 (calc) (ref 1.0–2.5)
ALT: 31 U/L — ABNORMAL HIGH (ref 6–29)
AST: 22 U/L (ref 10–30)
Albumin: 4.3 g/dL (ref 3.6–5.1)
Alkaline phosphatase (APISO): 61 U/L (ref 31–125)
BUN: 7 mg/dL (ref 7–25)
CO2: 27 mmol/L (ref 20–32)
Calcium: 9.3 mg/dL (ref 8.6–10.2)
Chloride: 104 mmol/L (ref 98–110)
Creat: 0.69 mg/dL (ref 0.50–0.99)
Globulin: 2.8 g/dL (calc) (ref 1.9–3.7)
Glucose, Bld: 81 mg/dL (ref 65–139)
Potassium: 3.9 mmol/L (ref 3.5–5.3)
Sodium: 140 mmol/L (ref 135–146)
Total Bilirubin: 0.5 mg/dL (ref 0.2–1.2)
Total Protein: 7.1 g/dL (ref 6.1–8.1)
eGFR: 112 mL/min/{1.73_m2} (ref >=60)

## 2022-05-01 LAB — HEMOGLOBIN A1C
Hgb A1c MFr Bld: 5.9 % of total Hgb — ABNORMAL HIGH (ref <5.7)
Mean Plasma Glucose: 123 mg/dL
eAG (mmol/L): 6.8 mmol/L

## 2022-05-01 LAB — LIPID PANEL W/REFLEX DIRECT LDL
Cholesterol: 188 mg/dL (ref <200)
HDL: 37 mg/dL — ABNORMAL LOW (ref >=50)
LDL Cholesterol (Calc): 119 mg/dL (calc) — ABNORMAL HIGH
Non-HDL Cholesterol (Calc): 151 mg/dL (calc) — ABNORMAL HIGH (ref <130)
Total CHOL/HDL Ratio: 5.1 (calc) — ABNORMAL HIGH (ref <5.0)
Triglycerides: 203 mg/dL — ABNORMAL HIGH (ref <150)

## 2022-05-01 LAB — TSH+FREE T4: TSH W/REFLEX TO FT4: 1 mIU/L

## 2022-05-03 ENCOUNTER — Other Ambulatory Visit (HOSPITAL_COMMUNITY): Payer: Self-pay

## 2022-05-08 ENCOUNTER — Other Ambulatory Visit (HOSPITAL_COMMUNITY): Payer: Self-pay

## 2022-05-08 MED ORDER — AMPHETAMINE-DEXTROAMPHETAMINE 20 MG PO TABS
ORAL_TABLET | ORAL | 0 refills | Status: DC
  Filled 2022-05-08: qty 45, 30d supply, fill #0

## 2022-05-17 ENCOUNTER — Other Ambulatory Visit (HOSPITAL_COMMUNITY): Payer: Self-pay

## 2022-05-17 MED ORDER — LEVOTHYROXINE SODIUM 100 MCG PO TABS
ORAL_TABLET | ORAL | 2 refills | Status: DC
  Filled 2022-05-17: qty 30, 30d supply, fill #0
  Filled 2022-06-14: qty 30, 30d supply, fill #1
  Filled 2022-07-11: qty 30, 30d supply, fill #2

## 2022-05-18 ENCOUNTER — Other Ambulatory Visit (HOSPITAL_COMMUNITY): Payer: Self-pay

## 2022-05-21 ENCOUNTER — Other Ambulatory Visit (HOSPITAL_COMMUNITY): Payer: Self-pay

## 2022-06-11 NOTE — Progress Notes (Unsigned)
Nexplanon Removal and Reinsertion Procedure Note PRE-OP DIAGNOSIS: Nexplanon, desire for continuation of same contraception  POST-OP DIAGNOSIS: Same  PROCEDURE: Nexplanon Removal and reinsertion Performing Provider: Christen Butter, FNP  Risks and benefits reviewed with the patient. Informed consent obtained, patient opts to proceed today.   PROCEDURE:  Anesthesia: 1% Lidocaine without epinephrine 5 ml  Procedure: Consent obtained. A time-out was performed prior to initiating procedure to be sure of right patient and right location. The area surrounding the Nexplanon was prepared in the usual sterile manner. The site was anesthetized with lidocaine. A skin incision was made over the distal aspect of the device. The capsule lysed sharply and the device removed using a hemostat. Nexplanon  trocar was inserted subcutaneously and then Nexplanon  capsule delivered subcutaneously. Trocar was removed from the insertion site. Nexplanon  capsule was palpated by provider and patient to assure satisfactory placement. Estimated blood loss <1 mL Dressings applied: Steri-Strip x 1 and small pressure bandage  Followup: The patient tolerated the procedure well without complications. Standard post-procedure care is explained and return precautions are given. Contraception is advised until conception is desired.  ___________________________________________ Erica Ohm, DNP, APRN, FNP-BC Primary Care and Sports Medicine [facility] [place]

## 2022-06-13 ENCOUNTER — Encounter: Payer: Self-pay | Admitting: Medical-Surgical

## 2022-06-13 ENCOUNTER — Ambulatory Visit (INDEPENDENT_AMBULATORY_CARE_PROVIDER_SITE_OTHER): Payer: 59 | Admitting: Medical-Surgical

## 2022-06-13 VITALS — BP 145/88 | HR 101 | Resp 20 | Ht 62.0 in | Wt 168.2 lb

## 2022-06-13 DIAGNOSIS — Z3046 Encounter for surveillance of implantable subdermal contraceptive: Secondary | ICD-10-CM

## 2022-06-14 ENCOUNTER — Other Ambulatory Visit (HOSPITAL_COMMUNITY): Payer: Self-pay

## 2022-06-17 ENCOUNTER — Other Ambulatory Visit (HOSPITAL_COMMUNITY): Payer: Self-pay

## 2022-06-18 IMAGING — DX DG CHEST 2V
1 series · 1 of 1 positions shown · non-contrast
Comparison: None.

CLINICAL DATA: Preop evaluation for upcoming thyroidectomy.

EXAM:
CHEST - 2 VIEW

[chest pa]
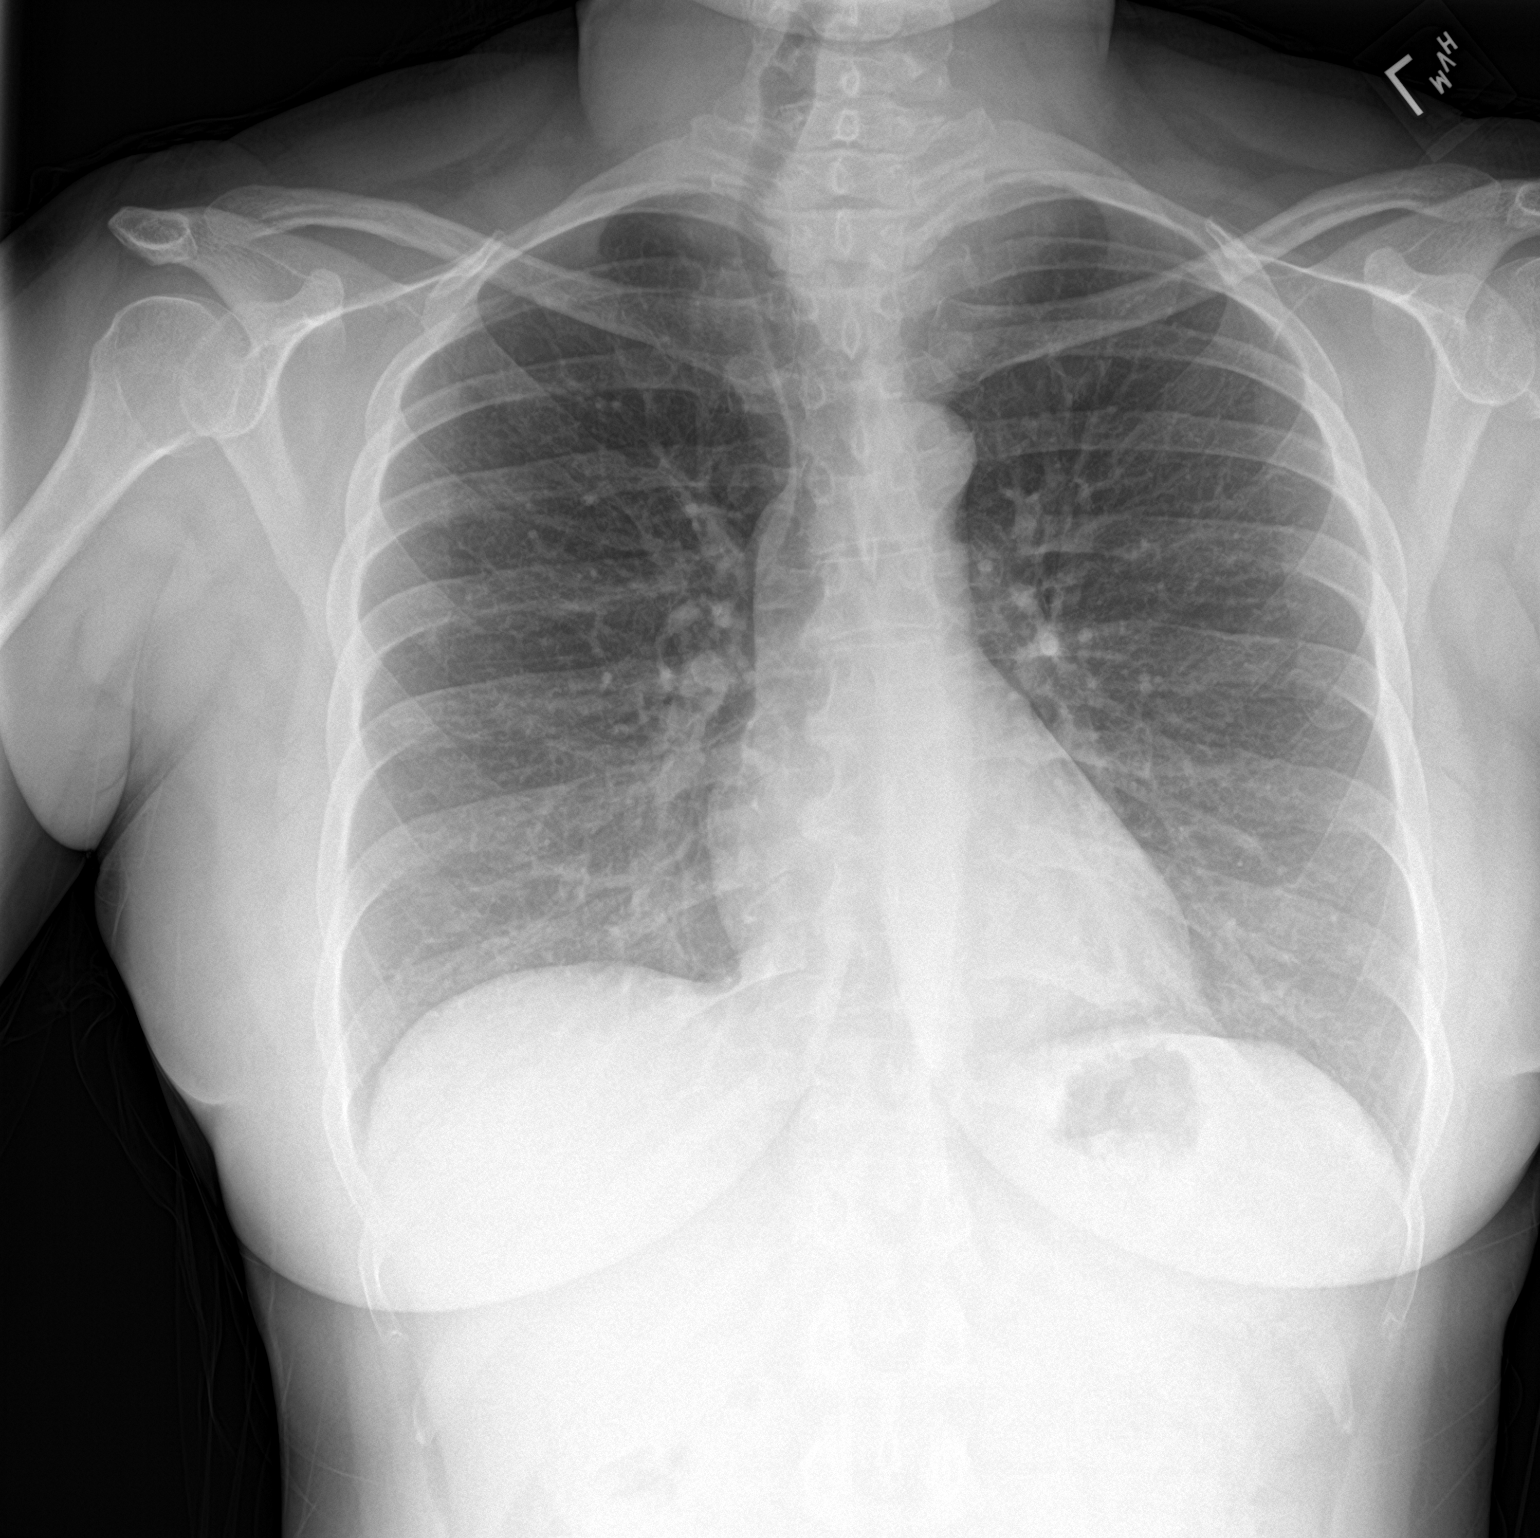

[1 of 1 positions shown; findings below may reference images not displayed]

FINDINGS: Cardiac shadow is within normal limits. The lungs are clear
bilaterally. No sizable effusion is noted. No bony abnormality is
noted. Soft tissue density is noted to the left of the midline with
deviation of the trachea consistent with the prominent thyroid.
IMPRESSION: Deviation of the trachea secondary to enlarged thyroid. No other
focal abnormality is noted.

## 2022-06-19 ENCOUNTER — Other Ambulatory Visit (HOSPITAL_COMMUNITY): Payer: Self-pay

## 2022-06-21 ENCOUNTER — Other Ambulatory Visit (HOSPITAL_COMMUNITY): Payer: Self-pay

## 2022-07-01 ENCOUNTER — Other Ambulatory Visit (HOSPITAL_COMMUNITY): Payer: Self-pay

## 2022-07-09 ENCOUNTER — Other Ambulatory Visit (HOSPITAL_COMMUNITY): Payer: Self-pay

## 2022-07-09 DIAGNOSIS — F900 Attention-deficit hyperactivity disorder, predominantly inattentive type: Secondary | ICD-10-CM | POA: Diagnosis not present

## 2022-07-09 MED ORDER — AMPHETAMINE-DEXTROAMPHETAMINE 20 MG PO TABS
ORAL_TABLET | ORAL | 0 refills | Status: AC
  Filled 2022-07-09: qty 45, 30d supply, fill #0

## 2022-07-11 ENCOUNTER — Other Ambulatory Visit (HOSPITAL_COMMUNITY): Payer: Self-pay

## 2022-08-20 ENCOUNTER — Other Ambulatory Visit: Payer: Self-pay

## 2022-08-20 DIAGNOSIS — E890 Postprocedural hypothyroidism: Secondary | ICD-10-CM

## 2022-08-20 MED ORDER — LEVOTHYROXINE SODIUM 100 MCG PO TABS: ORAL_TABLET | ORAL | 2 refills | Status: DC

## 2022-08-26 ENCOUNTER — Ambulatory Visit: Payer: 59 | Admitting: Family Medicine

## 2022-08-26 ENCOUNTER — Encounter: Payer: Self-pay | Admitting: Family Medicine

## 2022-08-26 VITALS — BP 167/107 | HR 95 | Ht 62.0 in | Wt 168.0 lb

## 2022-08-26 DIAGNOSIS — R0989 Other specified symptoms and signs involving the circulatory and respiratory systems: Secondary | ICD-10-CM | POA: Insufficient documentation

## 2022-08-26 DIAGNOSIS — B9689 Other specified bacterial agents as the cause of diseases classified elsewhere: Secondary | ICD-10-CM | POA: Diagnosis not present

## 2022-08-26 DIAGNOSIS — R030 Elevated blood-pressure reading, without diagnosis of hypertension: Secondary | ICD-10-CM | POA: Diagnosis not present

## 2022-08-26 DIAGNOSIS — J019 Acute sinusitis, unspecified: Secondary | ICD-10-CM

## 2022-08-26 MED ORDER — AMOXICILLIN-POT CLAVULANATE 875-125 MG PO TABS: 1.0000 | ORAL_TABLET | Freq: Two times a day (BID) | ORAL | 0 refills | Status: DC

## 2022-08-26 NOTE — Assessment & Plan Note (Addendum)
-   will repeat BP prior to leaving. Repeat bp still elevated. Did discuss with patient.  - will follow up with PCP in one week to see if elevated BP was a cause of feeling sick. Was on sudafed last week  - prominent JVD.

## 2022-08-26 NOTE — Assessment & Plan Note (Signed)
-   given augmentin for sinusitis - symptom onset over two weeks ago with tenderness to palpation of sinuses

## 2022-08-26 NOTE — Progress Notes (Signed)
Acute Office Visit  Subjective:     Patient ID: Erica Coleman, female    DOB: 26-Oct-1981, 41 y.o.   MRN: [id number]  Chief Complaint  Patient presents with   Sinus Problem    HPI Patient is in today for sinus congestion. She has been having sinus pressure and is having headaches. Symptom onset was over two weeks ago. She self treated with sudafed and ibuprofen. No fevers or body aches.   Review of Systems  Constitutional:  Negative for chills and fever.  Respiratory:  Negative for cough and shortness of breath.   Cardiovascular:  Negative for chest pain.  Neurological:  Negative for headaches.       Objective:    BP (!) 176/107   Pulse 97   Ht 5\' 2"  (1.575 m)   Wt 168 lb (76.2 kg)   SpO2 100%   BMI 30.73 kg/m    Physical Exam Vitals and nursing note reviewed.  Constitutional:      General: She is not in acute distress.    Appearance: Normal appearance.  HENT:     Head: Normocephalic and atraumatic.     Comments: Tenderness to palpation of maxillary and frontal sinuses bilaterally    Right Ear: Tympanic membrane, ear canal and external ear normal.     Left Ear: Tympanic membrane, ear canal and external ear normal.     Nose: Nose normal.  Eyes:     Conjunctiva/sclera: Conjunctivae normal.  Cardiovascular:     Rate and Rhythm: Normal rate and regular rhythm.     Comments: Prominent JVD on exam.  Pulmonary:     Effort: Pulmonary effort is normal.     Breath sounds: Normal breath sounds.  Neurological:     General: No focal deficit present.     Mental Status: She is alert and oriented to person, place, and time.  Psychiatric:        Mood and Affect: Mood normal.        Behavior: Behavior normal.        Thought Content: Thought content normal.        Judgment: Judgment normal.     No results found for any visits on 08/26/22.      Assessment & Plan:   Problem List Items Addressed This Visit       Respiratory   Acute bacterial sinusitis -  Primary    - given augmentin for sinusitis - symptom onset over two weeks ago with tenderness to palpation of sinuses       Relevant Medications   amoxicillin-clavulanate (AUGMENTIN) 875-125 MG tablet   Chest congestion    - covid tested and interpreted by myself as negative  - flu testing interpreted by myself and is negative        Other   Elevated blood pressure reading    - will repeat BP prior to leaving. Repeat bp still elevated. Did discuss with patient.  - will follow up with PCP in one week to see if elevated BP was a cause of feeling sick. Was on sudafed last week  - prominent JVD.        Meds ordered this encounter  Medications   amoxicillin-clavulanate (AUGMENTIN) 875-125 MG tablet    Sig: Take 1 tablet by mouth 2 (two) times daily.    Dispense:  20 tablet    Refill:  0    Return in about 8 days (around 09/03/2022) for with PCP for HTN check.  Maisie Fus  Mel Almond, DO

## 2022-08-26 NOTE — Assessment & Plan Note (Signed)
-   covid tested and interpreted by myself as negative  - flu testing interpreted by myself and is negative

## 2022-09-03 ENCOUNTER — Ambulatory Visit: Payer: 59 | Admitting: Family Medicine

## 2022-09-03 ENCOUNTER — Encounter: Payer: Self-pay | Admitting: Family Medicine

## 2022-09-03 VITALS — BP 155/103 | HR 97 | Ht 62.0 in | Wt 168.0 lb

## 2022-09-03 DIAGNOSIS — I10 Essential (primary) hypertension: Secondary | ICD-10-CM | POA: Diagnosis not present

## 2022-09-03 DIAGNOSIS — Z23 Encounter for immunization: Secondary | ICD-10-CM

## 2022-09-03 MED ORDER — AMLODIPINE BESYLATE 5 MG PO TABS: 5.0000 mg | ORAL_TABLET | Freq: Every day | ORAL | 1 refills | Status: DC

## 2022-09-03 NOTE — Assessment & Plan Note (Signed)
Starting amlodipine 5mg  daily.  Encouraged to continue to work on diet and exercise.  F/u in 2 weeks for nurse visit to recheck BP.

## 2022-09-03 NOTE — Patient Instructions (Addendum)
Let's add amlodipine 5mg  daily    Managing Your Hypertension Hypertension, also called high blood pressure, is when the force of the blood pressing against the walls of the arteries is too strong. Arteries are blood vessels that carry blood from your heart throughout your body. Hypertension forces the heart to work harder to pump blood and may cause the arteries to become narrow or stiff. Understanding blood pressure readings A blood pressure reading includes a higher number over a lower number: The first, or top, number is called the systolic pressure. It is a measure of the pressure in your arteries as your heart beats. The second, or bottom number, is called the diastolic pressure. It is a measure of the pressure in your arteries as the heart relaxes. For most people, a normal blood pressure is below 120/80. Your personal target blood pressure may vary depending on your medical conditions, your age, and other factors. Blood pressure is classified into four stages. Based on your blood pressure reading, your health care provider may use the following stages to determine what type of treatment you need, if any. Systolic pressure and diastolic pressure are measured in a unit called millimeters of mercury (mmHg). Normal Systolic pressure: below 120. Diastolic pressure: below 80. Elevated Systolic pressure: 120-129. Diastolic pressure: below 80. Hypertension stage 1 Systolic pressure: 130-139. Diastolic pressure: 80-89. Hypertension stage 2 Systolic pressure: 140 or above. Diastolic pressure: 90 or above. How can this condition affect me? Managing your hypertension is very important. Over time, hypertension can damage the arteries and decrease blood flow to parts of the body, including the brain, heart, and kidneys. Having untreated or uncontrolled hypertension can lead to: A heart attack. A stroke. A weakened blood vessel (aneurysm). Heart failure. Kidney damage. Eye damage. Memory and  concentration problems. Vascular dementia. What actions can I take to manage this condition? Hypertension can be managed by making lifestyle changes and possibly by taking medicines. Your health care provider will help you make a plan to bring your blood pressure within a normal range. You may be referred for counseling on a healthy diet and physical activity. Nutrition  Eat a diet that is high in fiber and potassium, and low in salt (sodium), added sugar, and fat. An example eating plan is called the DASH diet. DASH stands for Dietary Approaches to Stop Hypertension. To eat this way: Eat plenty of fresh fruits and vegetables. Try to fill one-half of your plate at each meal with fruits and vegetables. Eat whole grains, such as whole-wheat pasta, brown rice, or whole-grain bread. Fill about one-fourth of your plate with whole grains. Eat low-fat dairy products. Avoid fatty cuts of meat, processed or cured meats, and poultry with skin. Fill about one-fourth of your plate with lean proteins such as fish, chicken without skin, beans, eggs, and tofu. Avoid pre-made and processed foods. These tend to be higher in sodium, added sugar, and fat. Reduce your daily sodium intake. Many people with hypertension should eat less than 1,500 mg of sodium a day. Lifestyle  Work with your health care provider to maintain a healthy body weight or to lose weight. Ask what an ideal weight is for you. Get at least 30 minutes of exercise that causes your heart to beat faster (aerobic exercise) most days of the week. Activities may include walking, swimming, or biking. Include exercise to strengthen your muscles (resistance exercise), such as weight lifting, as part of your weekly exercise routine. Try to do these types of exercises for 30  minutes at least 3 days a week. Do not use any products that contain nicotine or tobacco. These products include cigarettes, chewing tobacco, and vaping devices, such as e-cigarettes. If  you need help quitting, ask your health care provider. Control any long-term (chronic) conditions you have, such as high cholesterol or diabetes. Identify your sources of stress and find ways to manage stress. This may include meditation, deep breathing, or making time for fun activities. Alcohol use Do not drink alcohol if: Your health care provider tells you not to drink. You are pregnant, may be pregnant, or are planning to become pregnant. If you drink alcohol: Limit how much you have to: 0-1 drink a day for women. 0-2 drinks a day for men. Know how much alcohol is in your drink. In the [place] one drink equals one 12 oz bottle of beer (355 mL), one 5 oz glass of wine (148 mL), or one 1 oz glass of hard liquor (44 mL). Medicines Your health care provider may prescribe medicine if lifestyle changes are not enough to get your blood pressure under control and if: Your systolic blood pressure is 130 or higher. Your diastolic blood pressure is 80 or higher. Take medicines only as told by your health care provider. Follow the directions carefully. Blood pressure medicines must be taken as told by your health care provider. The medicine does not work as well when you skip doses. Skipping doses also puts you at risk for problems. Monitoring Before you monitor your blood pressure: Do not smoke, drink caffeinated beverages, or exercise within 30 minutes before taking a measurement. Use the bathroom and empty your bladder (urinate). Sit quietly for at least 5 minutes before taking measurements. Monitor your blood pressure at home as told by your health care provider. To do this: Sit with your back straight and supported. Place your feet flat on the floor. Do not cross your legs. Support your arm on a flat surface, such as a table. Make sure your upper arm is at heart level. Each time you measure, take two or three readings one minute apart and record the results. You may also need to have your  blood pressure checked regularly by your health care provider. General information Talk with your health care provider about your diet, exercise habits, and other lifestyle factors that may be contributing to hypertension. Review all the medicines you take with your health care provider because there may be side effects or interactions. Keep all follow-up visits. Your health care provider can help you create and adjust your plan for managing your high blood pressure. Where to find more information National Heart, Lung, and Blood Institute: https://wilson-eaton.com/ American Heart Association: www.heart.org Contact a health care provider if: You think you are having a reaction to medicines you have taken. You have repeated (recurrent) headaches. You feel dizzy. You have swelling in your ankles. You have trouble with your vision. Get help right away if: You develop a severe headache or confusion. You have unusual weakness or numbness, or you feel faint. You have severe pain in your chest or abdomen. You vomit repeatedly. You have trouble breathing. These symptoms may be an emergency. Get help right away. Call 911. Do not wait to see if the symptoms will go away. Do not drive yourself to the hospital. Summary Hypertension is when the force of blood pumping through your arteries is too strong. If this condition is not controlled, it may put you at risk for serious complications. Your personal target blood  pressure may vary depending on your medical conditions, your age, and other factors. For most people, a normal blood pressure is less than 120/80. Hypertension is managed by lifestyle changes, medicines, or both. Lifestyle changes to help manage hypertension include losing weight, eating a healthy, low-sodium diet, exercising more, stopping smoking, and limiting alcohol. This information is not intended to replace advice given to you by your health care provider. Make sure you discuss any questions  you have with your health care provider. Document Revised: 07/12/2021 Document Reviewed: 07/12/2021 Elsevier Patient Education  2023 [employer].

## 2022-09-03 NOTE — Progress Notes (Signed)
Erica Coleman - 41 y.o. female MRN [id number]  Date of birth: [birth year]  Subjective No chief complaint on file.   HPI Erica Coleman is a  41 y.o. female here today for follow up of elevated blood pressure.  Seen recnetly for sinus infection.  Noted to have elevated blood pressure, recommend to f/u with PCP.  She was taking decongestant at that time.  BP still remains elevated today.  She denies chest pain, shortness of breath, palpitations, headache or vision changes.  She feels that stress is better in her new job.  She eats pretty healthy.  She does not exercise.   ROS:  A comprehensive ROS was completed and negative except as noted per HPI  Allergies  Allergen Reactions   Sulfa Antibiotics Other (See Comments) and Anxiety    Other reaction(s): Agitation sweating    Past Medical History:  Diagnosis Date   Heart murmur    as a child   Hypertension    no meds  now   Pre-diabetes    diet controlled    Past Surgical History:  Procedure Laterality Date   THYROIDECTOMY N/A 03/30/2021   Procedure: TOTAL THYROIDECTOMY;  Surgeon: Armandina Gemma, MD;  Location: [facility];  Service: General;  Laterality: N/A;   WISDOM TOOTH EXTRACTION      Social History   Socioeconomic History   Marital status: Single    Spouse name: Not on file   Number of children: Not on file   Years of education: Not on file   Highest education level: Not on file  Occupational History   Not on file  Tobacco Use   Smoking status: Former    Packs/day: 0.50    Years: 10.00    Total pack years: 5.00    Types: Cigarettes    Quit date: 11/11/2013    Years since quitting: 8.8   Smokeless tobacco: Never  Vaping Use   Vaping Use: Former   Start date: 12/16/2013   Quit date: 03/20/2017   Substances: Nicotine, Flavoring  Substance and Sexual Activity   Alcohol use: Yes    Comment: Socially   Drug use: Never   Sexual activity: Yes    Birth control/protection: Implant  Other Topics Concern   Not on file   Social History Narrative   Not on file   Social Determinants of Health   Financial Resource Strain: Not on file  Food Insecurity: Not on file  Transportation Needs: Not on file  Physical Activity: Not on file  Stress: Not on file  Social Connections: Not on file    Family History  Problem Relation Age of Onset   Cancer Mother    Hypertension Mother    Diabetes Paternal Grandmother    Early death Brother    Kidney disease Brother     Health Maintenance  Topic Date Due   COVID-19 Vaccine (2 - Booster for [employer] series) 09/19/2022 (Originally 04/17/2020)   Diabetic kidney evaluation - Urine ACR  12/12/2022 (Originally 07/29/2017)   PAP SMEAR-Modifier  12/12/2022 (Originally 04/06/2003)   OPHTHALMOLOGY EXAM  12/12/2022 (Originally 04/05/1992)   TETANUS/TDAP  05/10/2023 (Originally 04/05/2001)   Hepatitis C Screening  09/04/2023 (Originally 04/05/2000)   HIV Screening  09/04/2023 (Originally 04/05/1997)   HEMOGLOBIN A1C  10/30/2022   Diabetic kidney evaluation - GFR measurement  05/01/2023   FOOT EXAM  09/04/2023   INFLUENZA VACCINE  Completed   HPV VACCINES  Aged Out     ----------------------------------------------------------------------------------------------------------------------------------------------------------------------------------------------------------------- Physical Exam BP (!) 155/103 (  BP Location: Left Arm, Patient Position: Sitting, Cuff Size: Normal)   Pulse 97   Ht 5\' 2"  (1.575 m)   Wt 168 lb (76.2 kg)   SpO2 100%   BMI 30.73 kg/m   Physical Exam Constitutional:      Appearance: Normal appearance.  HENT:     Head: Normocephalic and atraumatic.  Cardiovascular:     Rate and Rhythm: Normal rate and regular rhythm.  Pulmonary:     Effort: Pulmonary effort is normal.     Breath sounds: Normal breath sounds.  Neurological:     Mental Status: She is alert.  Psychiatric:        Mood and Affect: Mood normal.        Behavior: Behavior normal.      ------------------------------------------------------------------------------------------------------------------------------------------------------------------------------------------------------------------- Assessment and Plan  Essential hypertension Starting amlodipine 5mg  daily.  Encouraged to continue to work on diet and exercise.  F/u in 2 weeks for nurse visit to recheck BP.     Meds ordered this encounter  Medications   amLODipine (NORVASC) 5 MG tablet    Sig: Take 1 tablet (5 mg total) by mouth daily.    Dispense:  90 tablet    Refill:  1    No follow-ups on file.    This visit occurred during the SARS-CoV-2 public health emergency.  Safety protocols were in place, including screening questions prior to the visit, additional usage of staff PPE, and extensive cleaning of exam room while observing appropriate contact time as indicated for disinfecting solutions.

## 2022-10-30 ENCOUNTER — Ambulatory Visit: Payer: 59 | Admitting: Family Medicine

## 2022-10-30 ENCOUNTER — Encounter: Payer: Self-pay | Admitting: Family Medicine

## 2022-10-30 VITALS — BP 143/87 | HR 99 | Ht 62.0 in | Wt 170.0 lb

## 2022-10-30 DIAGNOSIS — I10 Essential (primary) hypertension: Secondary | ICD-10-CM | POA: Diagnosis not present

## 2022-10-30 MED ORDER — HYDROCHLOROTHIAZIDE 12.5 MG PO TABS: 12.5000 mg | ORAL_TABLET | Freq: Every day | ORAL | 1 refills | Status: DC

## 2022-10-30 NOTE — Progress Notes (Signed)
Erica Coleman - 41 y.o. female MRN [id number]  Date of birth: [birth year]  Subjective Chief Complaint  Patient presents with   Hypertension    HPI Erica Coleman is a 41 y.o. female here today for follow-up of hypertension.  Current management with amlodipine.  She has been checking blood pressure at home with readings similar to what we are seeing here in the clinic.  Denies any symptoms related to hypertension including chest pain, shortness of breath, palpitations, headaches or vision changes.  She has made significant dietary changes to help with management of her blood pressure and reports that her job is not nearly as stressful at this time.    ROS:  A comprehensive ROS was completed and negative except as noted per HPI  Allergies  Allergen Reactions   Sulfa Antibiotics Other (See Comments) and Anxiety    Other reaction(s): Agitation sweating    Past Medical History:  Diagnosis Date   Heart murmur    as a child   Hypertension    no meds  now   Pre-diabetes    diet controlled    Past Surgical History:  Procedure Laterality Date   THYROIDECTOMY N/A 03/30/2021   Procedure: TOTAL THYROIDECTOMY;  Surgeon: Armandina Gemma, MD;  Location: [facility];  Service: General;  Laterality: N/A;   WISDOM TOOTH EXTRACTION      Social History   Socioeconomic History   Marital status: Single    Spouse name: Not on file   Number of children: Not on file   Years of education: Not on file   Highest education level: Not on file  Occupational History   Not on file  Tobacco Use   Smoking status: Former    Packs/day: 0.50    Years: 10.00    Total pack years: 5.00    Types: Cigarettes    Quit date: 11/11/2013    Years since quitting: 8.9   Smokeless tobacco: Never  Vaping Use   Vaping Use: Former   Start date: 12/16/2013   Quit date: 03/20/2017   Substances: Nicotine, Flavoring  Substance and Sexual Activity   Alcohol use: Yes    Comment: Socially   Drug use: Never   Sexual  activity: Yes    Birth control/protection: Implant  Other Topics Concern   Not on file  Social History Narrative   Not on file   Social Determinants of Health   Financial Resource Strain: Not on file  Food Insecurity: Not on file  Transportation Needs: Not on file  Physical Activity: Not on file  Stress: Not on file  Social Connections: Not on file    Family History  Problem Relation Age of Onset   Cancer Mother    Hypertension Mother    Diabetes Paternal Grandmother    Early death Brother    Kidney disease Brother     Health Maintenance  Topic Date Due   DTaP/Tdap/Td (5 - Tdap) 04/05/1993   HEMOGLOBIN A1C  10/30/2022   COVID-19 Vaccine (2 - 2023-24 season) 11/15/2022 (Originally 07/12/2022)   Diabetic kidney evaluation - Urine ACR  12/12/2022 (Originally 04/05/2000)   PAP SMEAR-Modifier  12/12/2022 (Originally 04/06/2003)   OPHTHALMOLOGY EXAM  12/12/2022 (Originally 04/05/1992)   Hepatitis C Screening  09/04/2023 (Originally 04/05/2000)   HIV Screening  09/04/2023 (Originally 04/05/1997)   Diabetic kidney evaluation - eGFR measurement  05/01/2023   FOOT EXAM  09/04/2023   INFLUENZA VACCINE  Completed   HPV VACCINES  Aged Out     -----------------------------------------------------------------------------------------------------------------------------------------------------------------------------------------------------------------  Physical Exam BP (!) 143/87 (BP Location: Left Arm, Patient Position: Supine, Cuff Size: Normal)   Pulse 99   Ht _0  (1.575 m)   Wt 170 lb (77.1 kg)   SpO2 100%   BMI 31.09 kg/m   Physical Exam Constitutional:      Appearance: Normal appearance.  HENT:     Head: Normocephalic and atraumatic.  Eyes:     General: No scleral icterus. Neurological:     Mental Status: She is alert.  Psychiatric:        Mood and Affect: Mood normal.        Behavior: Behavior normal.      ------------------------------------------------------------------------------------------------------------------------------------------------------------------------------------------------------------------- Assessment and Plan  Essential hypertension Blood pressure remains elevated.  Recommend continuation of amlodipine with addition of hydrochlorothiazide 12.5 mg daily.  Asked that she check her blood pressure at home and send readings over the next couple weeks.   Meds ordered this encounter  Medications   hydrochlorothiazide (HYDRODIURIL) 12.5 MG tablet    Sig: Take 1 tablet (12.5 mg total) by mouth daily.    Dispense:  90 tablet    Refill:  1    Return in about 2 months (around 12/31/2022) for F/u HTN/Update PAP.    This visit occurred during the SARS-CoV-2 public health emergency.  Safety protocols were in place, including screening questions prior to the visit, additional usage of staff PPE, and extensive cleaning of exam room while observing appropriate contact time as indicated for disinfecting solutions.

## 2022-10-30 NOTE — Assessment & Plan Note (Signed)
Blood pressure remains elevated.  Recommend continuation of amlodipine with addition of hydrochlorothiazide 12.5 mg daily.  Asked that she check her blood pressure at home and send readings over the next couple weeks.

## 2022-11-23 ENCOUNTER — Other Ambulatory Visit: Payer: Self-pay | Admitting: Family Medicine

## 2022-11-23 DIAGNOSIS — E890 Postprocedural hypothyroidism: Secondary | ICD-10-CM

## 2022-12-12 ENCOUNTER — Telehealth: Payer: Self-pay | Admitting: Neurology

## 2022-12-12 MED ORDER — AMLODIPINE BESYLATE 10 MG PO TABS: 10.0000 mg | ORAL_TABLET | Freq: Every day | ORAL | 1 refills | Status: DC

## 2022-12-12 NOTE — Addendum Note (Signed)
Addended by: Perlie Mayo on: 12/12/2022 05:04 PM   Modules accepted: Orders

## 2022-12-12 NOTE — Telephone Encounter (Signed)
Patient called and LVM asking for an increase in her Amlodipine RX to be sent to the pharmacy.   Her blood pressure was running 140/90's with Amlodipine 5 mg and HCTZ 12.5. She increased this on her own to Amlodipine 10 mg and states blood pressures have been running 130/80's.   Please advise.

## 2022-12-12 NOTE — Telephone Encounter (Signed)
Updated rx for amlodipine 10mg  sent in.   CM

## 2022-12-13 NOTE — Telephone Encounter (Signed)
Patient advised.

## 2022-12-25 ENCOUNTER — Encounter: Payer: Self-pay | Admitting: Pharmacist

## 2022-12-27 ENCOUNTER — Other Ambulatory Visit: Payer: Self-pay | Admitting: Pharmacist

## 2022-12-27 NOTE — Progress Notes (Signed)
Patient appearing on report for [employer] - Hypertension Control report due to last documented ambulatory blood pressure of 143/87 on 10/30/22. Next appointment with PCP is 12/31/22   Outreached patient to discuss hypertension control and medication management. Left voicemail for patient to return my call at their convenience.   Larinda Buttery, PharmD Clinical Pharmacist [facility] [phone]

## 2022-12-30 ENCOUNTER — Other Ambulatory Visit: Payer: Self-pay | Admitting: Pharmacist

## 2022-12-30 NOTE — Progress Notes (Signed)
Patient appearing on report for [employer] - Hypertension Control report due to last documented ambulatory blood pressure of 143/87 on 10/30/22. Next appointment with PCP is 12/31/22   Outreached patient to discuss hypertension control and medication management.   Current antihypertensives: amlodipine 67m daily, hctz 12.568mdaily  Patient has an automated upper arm home BP machine.  Current blood pressure readings: 130/80s at home after recent (Dec 2023) addition of hctz, and increase (Feb 2024) of amlodipine dosage.   Patient denies hypotensive signs and symptoms including dizziness, lightheadedness.  Patient denies hypertensive symptoms including headache, chest pain, shortness of breath.   Assessment/Plan: - Currently controlled - - Reviewed to check blood pressure periodically, document, and provide at next provider visit - Recommend continue current regimen  KeLarinda ButteryPharmD Clinical Pharmacist [phone]

## 2022-12-31 ENCOUNTER — Ambulatory Visit: Payer: 59 | Admitting: Family Medicine

## 2022-12-31 ENCOUNTER — Encounter: Payer: Self-pay | Admitting: Family Medicine

## 2022-12-31 ENCOUNTER — Other Ambulatory Visit (HOSPITAL_COMMUNITY)
Admission: RE | Admit: 2022-12-31 | Discharge: 2022-12-31 | Disposition: A | Discharge status: Home / Self Care | Payer: 59 | Source: Ambulatory Visit | Attending: Family Medicine | Admitting: Family Medicine

## 2022-12-31 VITALS — BP 137/79 | HR 108 | Ht 62.0 in | Wt 166.0 lb

## 2022-12-31 DIAGNOSIS — I10 Essential (primary) hypertension: Secondary | ICD-10-CM

## 2022-12-31 DIAGNOSIS — R400 Somnolence: Secondary | ICD-10-CM | POA: Diagnosis not present

## 2022-12-31 DIAGNOSIS — Z124 Encounter for screening for malignant neoplasm of cervix: Secondary | ICD-10-CM | POA: Diagnosis not present

## 2022-12-31 NOTE — Assessment & Plan Note (Signed)
Referral placed for home sleep test.

## 2022-12-31 NOTE — Assessment & Plan Note (Signed)
Pap completed today.

## 2022-12-31 NOTE — Progress Notes (Signed)
Erica Coleman - 42 y.o. female MRN [id number]  Date of birth: 12/08/1980  Subjective Chief Complaint  Patient presents with   Gynecologic Exam    HPI Erica Coleman is a 42 year old female here today for follow-up visit.  She is due for updated Pap as well and like to have this completed today.  Blood pressure is well-controlled with amlodipine and hydrochlorothiazide at current strength.  Tolerating medications well at current strength.  Thinks he may have sleep apnea.  Has daytime fatigue does not feel like she sleeps well.  She has been told that she snores pretty heavily.  She has not had chest pain, shortness of breath, palpitations, headaches or vision changes.  ROS:  A comprehensive ROS was completed and negative except as noted per HPI  Allergies  Allergen Reactions   Sulfa Antibiotics Other (See Comments) and Anxiety    Other reaction(s): Agitation sweating    Past Medical History:  Diagnosis Date   Heart murmur    as a child   Hypertension    no meds  now   Pre-diabetes    diet controlled    Past Surgical History:  Procedure Laterality Date   THYROIDECTOMY N/A 03/30/2021   Procedure: TOTAL THYROIDECTOMY;  Surgeon: Armandina Gemma, MD;  Location: [facility];  Service: General;  Laterality: N/A;   WISDOM TOOTH EXTRACTION      Social History   Socioeconomic History   Marital status: Single    Spouse name: Not on file   Number of children: Not on file   Years of education: Not on file   Highest education level: Not on file  Occupational History   Not on file  Tobacco Use   Smoking status: Former    Packs/day: 0.50    Years: 10.00    Total pack years: 5.00    Types: Cigarettes    Quit date: 11/11/2013    Years since quitting: 9.1   Smokeless tobacco: Never  Vaping Use   Vaping Use: Former   Start date: 12/16/2013   Quit date: 03/20/2017   Substances: Nicotine, Flavoring  Substance and Sexual Activity   Alcohol use: Yes    Comment: Socially   Drug  use: Never   Sexual activity: Yes    Birth control/protection: Implant  Other Topics Concern   Not on file  Social History Narrative   Not on file   Social Determinants of Health   Financial Resource Strain: Not on file  Food Insecurity: Not on file  Transportation Needs: Not on file  Physical Activity: Not on file  Stress: Not on file  Social Connections: Not on file    Family History  Problem Relation Age of Onset   Cancer Mother    Hypertension Mother    Diabetes Paternal Grandmother    Early death Brother    Kidney disease Brother     Health Maintenance  Topic Date Due   OPHTHALMOLOGY EXAM  Never done   DTaP/Tdap/Td (5 - Tdap) 04/05/1993   Diabetic kidney evaluation - Urine ACR  Never done   PAP SMEAR-Modifier  Never done   COVID-19 Vaccine (2 - 2023-24 season) 07/12/2022   HEMOGLOBIN A1C  10/30/2022   Hepatitis C Screening  09/04/2023 (Originally 04/05/2000)   HIV Screening  09/04/2023 (Originally 04/05/1997)   Diabetic kidney evaluation - eGFR measurement  05/01/2023   FOOT EXAM  09/04/2023   INFLUENZA VACCINE  Completed   HPV VACCINES  Aged Out     -----------------------------------------------------------------------------------------------------------------------------------------------------------------------------------------------------------------  Physical Exam BP 137/79 (BP Location: Left Arm, Patient Position: Sitting, Cuff Size: Normal)   Pulse (!) 108   Ht 5' 2"$  (1.575 m)   Wt 166 lb (75.3 kg)   SpO2 100%   BMI 30.36 kg/m   Physical Exam Exam conducted with a chaperone present Leontine Locket, CMA).  Constitutional:      Appearance: Normal appearance.  HENT:     Head: Normocephalic and atraumatic.  Eyes:     General: No scleral icterus. Genitourinary:    General: Normal vulva.     Vagina: Normal.     Cervix: Normal.     Uterus: Normal.      Adnexa: Right adnexa normal and left adnexa normal.     Comments: Nabothian cysts  noted. Musculoskeletal:     Cervical back: Neck supple.  Neurological:     Mental Status: She is alert.  Psychiatric:        Mood and Affect: Mood normal.        Behavior: Behavior normal.     ------------------------------------------------------------------------------------------------------------------------------------------------------------------------------------------------------------------- Assessment and Plan  Essential hypertension Blood pressure is well-controlled at this time.  Recommend continuation of current medications.  Screening for cervical cancer Pap completed today.  Daytime somnolence Referral placed for home sleep test.   No orders of the defined types were placed in this encounter.   Return in about 6 months (around 07/01/2023) for HTN.    This visit occurred during the SARS-CoV-2 public health emergency.  Safety protocols were in place, including screening questions prior to the visit, additional usage of staff PPE, and extensive cleaning of exam room while observing appropriate contact time as indicated for disinfecting solutions.

## 2022-12-31 NOTE — Assessment & Plan Note (Signed)
Blood pressure is well-controlled at this time.  Recommend continuation of current medications.

## 2023-01-04 LAB — CYTOLOGY - PAP
Comment: NEGATIVE
Comment: NEGATIVE
Comment: NEGATIVE
Diagnosis: UNDETERMINED — AB
HPV 16: NEGATIVE
HPV 18 / 45: NEGATIVE
High risk HPV: POSITIVE — AB

## 2023-01-07 ENCOUNTER — Other Ambulatory Visit: Payer: Self-pay | Admitting: Family Medicine

## 2023-01-07 DIAGNOSIS — R87810 Cervical high risk human papillomavirus (HPV) DNA test positive: Secondary | ICD-10-CM

## 2023-01-27 ENCOUNTER — Other Ambulatory Visit (HOSPITAL_COMMUNITY)
Admission: RE | Admit: 2023-01-27 | Discharge: 2023-01-27 | Disposition: A | Discharge status: Home / Self Care | Payer: 59 | Source: Ambulatory Visit | Attending: Obstetrics & Gynecology | Admitting: Obstetrics & Gynecology

## 2023-01-27 ENCOUNTER — Ambulatory Visit (INDEPENDENT_AMBULATORY_CARE_PROVIDER_SITE_OTHER): Payer: 59 | Admitting: Obstetrics & Gynecology

## 2023-01-27 ENCOUNTER — Encounter: Payer: Self-pay | Admitting: Obstetrics & Gynecology

## 2023-01-27 VITALS — BP 139/77 | HR 106 | Ht 62.0 in | Wt 167.0 lb

## 2023-01-27 DIAGNOSIS — R87810 Cervical high risk human papillomavirus (HPV) DNA test positive: Secondary | ICD-10-CM

## 2023-01-27 DIAGNOSIS — R877 Abnormal histological findings in specimens from female genital organs: Secondary | ICD-10-CM

## 2023-01-27 DIAGNOSIS — R87619 Unspecified abnormal cytological findings in specimens from cervix uteri: Secondary | ICD-10-CM

## 2023-01-27 DIAGNOSIS — Z01812 Encounter for preprocedural laboratory examination: Secondary | ICD-10-CM

## 2023-01-27 DIAGNOSIS — R87610 Atypical squamous cells of undetermined significance on cytologic smear of cervix (ASC-US): Secondary | ICD-10-CM

## 2023-01-27 LAB — POCT URINE PREGNANCY: Preg Test, Ur: NEGATIVE

## 2023-01-27 NOTE — Progress Notes (Signed)
Patient ID: Erica Coleman, female   DOB: [birth year], 42 y.o.   MRN: [id number]   HPI Erica Coleman is a 42 y.o. female here for her first visit.  Dr. Zigmund Daniel dows her pap smears.  She reports all nml paps other than one in high school.    Indications: Pap smear on February 2024 showed: ASCUS with POSITIVE high risk HPV. Previous colposcopy: none. Prior cervical treatment: no treatment.  Past Medical History:  Diagnosis Date  . Heart murmur    as a child  . Hypertension    no meds  now  . Pre-diabetes    diet controlled    Past Surgical History:  Procedure Laterality Date  . THYROIDECTOMY N/A 03/30/2021   Procedure: TOTAL THYROIDECTOMY;  Surgeon: Armandina Gemma, MD;  Location: [facility];  Service: General;  Laterality: N/A;  . WISDOM TOOTH EXTRACTION      Family History  Problem Relation Age of Onset  . Cancer Mother   . Hypertension Mother   . Diabetes Paternal Grandmother   . Early death Brother   . Kidney disease Brother     Social History Social History   Tobacco Use  . Smoking status: Former    Packs/day: 0.50    Years: 10.00    Additional pack years: 0.00    Total pack years: 5.00    Types: Cigarettes    Quit date: 11/11/2013    Years since quitting: 9.2  . Smokeless tobacco: Never  Vaping Use  . Vaping Use: Former  . Start date: 12/16/2013  . Quit date: 03/20/2017  . Substances: Nicotine, Flavoring  Substance Use Topics  . Alcohol use: Yes    Comment: Socially  . Drug use: Never    Allergies  Allergen Reactions  . Sulfa Antibiotics Other (See Comments) and Anxiety    Other reaction(s): Agitation sweating    Current Outpatient Medications  Medication Sig Dispense Refill  . ACIDOPHILUS LACTOBACILLUS PO Take 1 capsule by mouth daily.    Marland Kitchen amLODipine (NORVASC) 10 MG tablet Take 1 tablet (10 mg total) by mouth daily. 90 tablet 1  . amphetamine-dextroamphetamine (ADDERALL) 20 MG tablet Take 1 tablet by mouth every morning and 1/2 tablet in the  afternoon 45 tablet 0  . amphetamine-dextroamphetamine (ADDERALL) 20 MG tablet Take 1 tablet by mouth in the morning and 1/2 tablet in afternoon 45 tablet 0  . Ashwagandha 500 MG CAPS Take 500 mg by mouth daily.    . cetirizine (ZYRTEC) 5 MG tablet Take 10 mg by mouth in the morning and at bedtime.    . Clindamycin-Benzoyl Per, Refr, gel Apply a small amount to affected area every morning 45 g 2  . clindamycin-benzoyl peroxide (BENZACLIN) gel Apply a small amount to affected area of acne every morning 50 g 2  . Dapsone (ACZONE) 7.5 % GEL Apply a small amount to affected area of acne at bedtime 60 g 2  . etonogestrel (NEXPLANON) 68 MG IMPL implant 1 each by Subdermal route once. Left arm    . Garlic 123XX123 MG CAPS Take 1,000 mg by mouth daily.    . hydrochlorothiazide (HYDRODIURIL) 12.5 MG tablet Take 1 tablet (12.5 mg total) by mouth daily. 90 tablet 1  . levothyroxine (SYNTHROID) 100 MCG tablet TAKE 1 TABLET BY MOUTH DAILY 30 tablet 2  . tretinoin (RETIN-A) 0.025 % cream Apply a small amount to affected area at bedtime 20 g 2  . vitamin C (ASCORBIC ACID) 500 MG tablet Take 500 mg  by mouth daily.     No current facility-administered medications for this visit.    Review of Systems Review of Systems  There were no vitals taken for this visit.  Physical Exam Physical Exam  Data Reviewed 2105 pap and 2024 pap   Assessment    Procedure Details  The risks and benefits of the procedure and Written informed consent obtained.  Speculum placed in vagina and excellent visualization of cervix achieved, cervix swabbed x 3 with acetic acid solution and lugols. AWE at 7 extending slightly into os; AWE at 11-12  Specimens: bx 12 and 7 0'clock; ECC  Complications: none.    Plan    Specimens labelled and sent to Pathology. Post biopsy instructions given Will communicate through my chart or video visit as needed.       Silas Sacramento 01/27/2023, 5:56 AM

## 2023-01-31 LAB — SURGICAL PATHOLOGY

## 2023-02-02 DIAGNOSIS — R877 Abnormal histological findings in specimens from female genital organs: Secondary | ICD-10-CM | POA: Insufficient documentation

## 2023-02-03 NOTE — Addendum Note (Signed)
Addended by: Asencion Islam on: 02/03/2023 08:59 AM   Modules accepted: Orders

## 2023-02-18 ENCOUNTER — Other Ambulatory Visit: Payer: Self-pay | Admitting: Family Medicine

## 2023-02-18 DIAGNOSIS — E890 Postprocedural hypothyroidism: Secondary | ICD-10-CM

## 2023-02-25 ENCOUNTER — Telehealth: Payer: 59 | Admitting: Family Medicine

## 2023-02-25 DIAGNOSIS — J3089 Other allergic rhinitis: Secondary | ICD-10-CM | POA: Diagnosis not present

## 2023-02-25 MED ORDER — ALBUTEROL SULFATE HFA 108 (90 BASE) MCG/ACT IN AERS
1.0000 | INHALATION_SPRAY | RESPIRATORY_TRACT | 0 refills | Status: AC | PRN
Start: 2023-02-25 — End: ?

## 2023-02-25 MED ORDER — MONTELUKAST SODIUM 10 MG PO TABS
10.0000 mg | ORAL_TABLET | Freq: Every day | ORAL | 1 refills | Status: AC
Start: 2023-02-25 — End: ?

## 2023-02-25 NOTE — Progress Notes (Signed)
Virtual Visit Consent   Erica Coleman, you are scheduled for a virtual visit with a [facility] provider today. Just as with appointments in the office, your consent must be obtained to participate. Your consent will be active for this visit and any virtual visit you may have with one of our providers in the next 365 days. If you have a MyChart account, a copy of this consent can be sent to you electronically.  As this is a virtual visit, video technology does not allow for your provider to perform a traditional examination. This may limit your provider's ability to fully assess your condition. If your provider identifies any concerns that need to be evaluated in person or the need to arrange testing (such as labs, EKG, etc.), we will make arrangements to do so. Although advances in technology are sophisticated, we cannot ensure that it will always work on either your end or our end. If the connection with a video visit is poor, the visit may have to be switched to a telephone visit. With either a video or telephone visit, we are not always able to ensure that we have a secure connection.  By engaging in this virtual visit, you consent to the provision of healthcare and authorize for your insurance to be billed (if applicable) for the services provided during this visit. Depending on your insurance coverage, you may receive a charge related to this service.  I need to obtain your verbal consent now. Are you willing to proceed with your visit today? Erica Coleman has provided verbal consent on 02/25/2023 for a virtual visit (video or telephone). Freddy Finner, NP  Date: 02/25/2023 1:47 PM  Virtual Visit via Video Note   I, Freddy Finner, connected with  Erica Coleman  ([id number], 1981/02/27) on 02/25/23 at  1:45 PM EDT by a video-enabled telemedicine application and verified that I am speaking with the correct person using two identifiers.  Location: Patient: Virtual Visit Location  Patient: Home Provider: Virtual Visit Location Provider: Home Office   I discussed the limitations of evaluation and management by telemedicine and the availability of in person appointments. The patient expressed understanding and agreed to proceed.    History of Present Illness: Erica Coleman is a 42 y.o. who identifies as a female who was assigned female at birth, and is being seen today for allergies.  Onset was for cough and wheezing in last 24 hours- has been dealing with allergies all season. Notes having to use an inhaler in the past. 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   Type 2 diabetes mellitus without complication 10/22/2015    Allergies:  Allergies  Allergen Reactions   Sulfa Antibiotics Other (See Comments) and Anxiety    Other reaction(s): Agitation sweating   Medications:  Current Outpatient Medications:    ACIDOPHILUS LACTOBACILLUS PO, Take 1 capsule by mouth daily., Disp: , Rfl:    amLODipine (NORVASC) 10 MG tablet, Take 1 tablet (10 mg total) by mouth daily., Disp: 90 tablet, Rfl: 1   amphetamine-dextroamphetamine (ADDERALL) 20 MG tablet, Take 1 tablet  by mouth every morning and 1/2 tablet in the afternoon, Disp: 45 tablet, Rfl: 0   amphetamine-dextroamphetamine (ADDERALL) 20 MG tablet, Take 1 tablet by mouth in the morning and 1/2 tablet in afternoon, Disp: 45 tablet, Rfl: 0   Ashwagandha 500 MG CAPS, Take 500 mg by mouth daily., Disp: , Rfl:    cetirizine (ZYRTEC) 5 MG tablet, Take 10 mg by  mouth in the morning and at bedtime., Disp: , Rfl:    Clindamycin-Benzoyl Per, Refr, gel, Apply a small amount to affected area every morning, Disp: 45 g, Rfl: 2   clindamycin-benzoyl peroxide (BENZACLIN) gel, Apply a small amount to affected area of acne every morning, Disp: 50 g, Rfl: 2   Dapsone (ACZONE) 7.5 % GEL, Apply a small amount to affected area of acne at bedtime, Disp: 60 g, Rfl: 2   etonogestrel (NEXPLANON) 68 MG IMPL implant, 1 each by Subdermal route once. Left arm, Disp: , Rfl:    Garlic 1000 MG CAPS, Take 1,000 mg by mouth daily., Disp: , Rfl:    hydrochlorothiazide (HYDRODIURIL) 12.5 MG tablet, Take 1 tablet (12.5 mg total) by mouth daily., Disp: 90 tablet, Rfl: 1   levothyroxine (SYNTHROID) 100 MCG tablet, TAKE 1 TABLET BY MOUTH DAILY, Disp: 30 tablet, Rfl: 5   tretinoin (RETIN-A) 0.025 % cream, Apply a small amount to affected area at bedtime, Disp: 20 g, Rfl: 2   vitamin C (ASCORBIC ACID) 500 MG tablet, Take 500 mg by mouth daily., Disp: , Rfl:   Observations/Objective: Patient is well-developed, well-nourished in no acute distress.  Resting comfortably  at home.  Head is normocephalic, atraumatic.  No labored breathing.  Speech is clear and coherent with logical content.  Patient is alert and oriented at baseline.    Assessment and Plan:  1. Environmental and seasonal allergies  - albuterol (VENTOLIN HFA) 108 (90 Base) MCG/ACT inhaler; Inhale 1-2 puffs into the lungs every 4 (four) hours as needed for wheezing or shortness of breath.  Dispense: 8 g; Refill: 0 - montelukast (SINGULAIR) 10 MG tablet; Take 1 tablet (10 mg total) by mouth at bedtime.  Dispense: 30 tablet; Refill: 1  -stop zyrtec, start singular (better asthma and allergy coverage) -question asthmatic allergies -follow up with PCP if these help so you can plan asthma action plan and for seasons to come.   Reviewed side effects, risks and benefits of medication.    Patient acknowledged agreement and  understanding of the plan.   Past Medical, Surgical, Social History, Allergies, and Medications have been Reviewed.     Follow Up Instructions: I discussed the assessment and treatment plan with the patient. The patient was provided an opportunity to ask questions and all were answered. The patient agreed with the plan and demonstrated an understanding of the instructions.  A copy of instructions were sent to the patient via MyChart unless otherwise noted below.    The patient was advised to call back or seek an in-person evaluation if the symptoms worsen or if the condition fails to improve as anticipated.  Time:  I spent 10 minutes with the patient via telehealth technology discussing the above problems/concerns.    Freddy Finner, NP

## 2023-02-25 NOTE — Patient Instructions (Addendum)
Erica Coleman, thank you for joining Freddy Finner, NP for today's virtual visit.  While this provider is not your primary care provider (PCP), if your PCP is located in our provider database this encounter information will be shared with them immediately following your visit.   A [facility] MyChart account gives you access to today's visit and all your visits, tests, and labs performed at [facility] " click here if you don't have a [facility] MyChart account or go to [email or web address]  Consent: (Patient) Erica Coleman provided verbal consent for this virtual visit at the beginning of the encounter.  Current Medications:  Current Outpatient Medications:    albuterol (VENTOLIN HFA) 108 (90 Base) MCG/ACT inhaler, Inhale 1-2 puffs into the lungs every 4 (four) hours as needed for wheezing or shortness of breath., Disp: 8 g, Rfl: 0   montelukast (SINGULAIR) 10 MG tablet, Take 1 tablet (10 mg total) by mouth at bedtime., Disp: 30 tablet, Rfl: 1   ACIDOPHILUS LACTOBACILLUS PO, Take 1 capsule by mouth daily., Disp: , Rfl:    amLODipine (NORVASC) 10 MG tablet, Take 1 tablet (10 mg total) by mouth daily., Disp: 90 tablet, Rfl: 1   amphetamine-dextroamphetamine (ADDERALL) 20 MG tablet, Take 1 tablet by mouth every morning and 1/2 tablet in the afternoon, Disp: 45 tablet, Rfl: 0   amphetamine-dextroamphetamine (ADDERALL) 20 MG tablet, Take 1 tablet by mouth in the morning and 1/2 tablet in afternoon, Disp: 45 tablet, Rfl: 0   Ashwagandha 500 MG CAPS, Take 500 mg by mouth daily., Disp: , Rfl:    cetirizine (ZYRTEC) 5 MG tablet, Take 10 mg by mouth in the morning and at bedtime., Disp: , Rfl:    Clindamycin-Benzoyl Per, Refr, gel, Apply a small amount to affected area every morning, Disp: 45 g, Rfl: 2   clindamycin-benzoyl peroxide (BENZACLIN) gel, Apply a small amount to affected area of acne every morning, Disp: 50 g, Rfl: 2   Dapsone (ACZONE) 7.5 % GEL, Apply a small  amount to affected area of acne at bedtime, Disp: 60 g, Rfl: 2   etonogestrel (NEXPLANON) 68 MG IMPL implant, 1 each by Subdermal route once. Left arm, Disp: , Rfl:    Garlic 1000 MG CAPS, Take 1,000 mg by mouth daily., Disp: , Rfl:    hydrochlorothiazide (HYDRODIURIL) 12.5 MG tablet, Take 1 tablet (12.5 mg total) by mouth daily., Disp: 90 tablet, Rfl: 1   levothyroxine (SYNTHROID) 100 MCG tablet, TAKE 1 TABLET BY MOUTH DAILY, Disp: 30 tablet, Rfl: 5   tretinoin (RETIN-A) 0.025 % cream, Apply a small amount to affected area at bedtime, Disp: 20 g, Rfl: 2   vitamin C (ASCORBIC ACID) 500 MG tablet, Take 500 mg by mouth daily., Disp: , Rfl:    Medications ordered in this encounter:  Meds ordered this encounter  Medications   albuterol (VENTOLIN HFA) 108 (90 Base) MCG/ACT inhaler    Sig: Inhale 1-2 puffs into the lungs every 4 (four) hours as needed for wheezing or shortness of breath.    Dispense:  8 g    Refill:  0    Order Specific Question:   Supervising Provider    Answer:   LAMPTEY, PHILIP O [[id number]]   montelukast (SINGULAIR) 10 MG tablet    Sig: Take 1 tablet (10 mg total) by mouth at bedtime.    Dispense:  30 tablet    Refill:  1    Order Specific Question:   Supervising Provider  AnswerMerrilee Jansky [[id number]]     *If you need refills on other medications prior to your next appointment, please contact your pharmacy*  Follow-Up: Call back or seek an in-person evaluation if the symptoms worsen or if the condition fails to improve as anticipated.  [facility] [phone]  Other Instructions  -stop zyrtec, start singular (better asthma and allergy coverage) -question asthmatic allergies -follow up with PCP if these help so you can plan asthma action plan and for seasons to come.  Asthma, Adult  Asthma is a condition that causes swelling and narrowing of the airways. These are the passages that lead from the nose and mouth down into the lungs. When  asthma symptoms get worse it is called an asthma attack or flare. This can make it hard to breathe. Asthma flares can range from minor to life-threatening. There is no cure for asthma, but medicines and lifestyle changes can help to control it. What are the causes? It is not known exactly what causes asthma, but certain things can cause asthma symptoms to get worse (triggers). What can trigger an asthma attack? Cigarette smoke. Mold. Dust. Your pet's skin flakes (dander). Cockroaches. Pollen. Air pollution (like household cleaners, wood smoke, smog, or Therapist, occupational). What are the signs or symptoms? Trouble breathing (shortness of breath). Coughing. Making high-pitched whistling sounds when you breathe, most often when you breathe out (wheezing). Chest tightness. Tiredness with little activity. Poor exercise tolerance. How is this treated? Controller medicines that help prevent asthma symptoms. Fast-acting reliever or rescue medicines. These give short-term relief of asthma symptoms. Allergy medicines if your attacks are brought on by allergens. Medicines to help control the body's defense (immune) system. Staying away from the things that cause asthma attacks. Follow these instructions at home: Avoiding triggers in your home Do not allow anyone to smoke in your home. Limit use of fireplaces and wood stoves. Get rid of pests (such as roaches and mice) and their droppings. Keep your home clean. Clean your floors. Dust regularly. Use cleaning products that do not smell. Wash bed sheets and blankets every week in hot water. Dry them in a dryer. Have someone vacuum when you are not home. Change your heating and air conditioning filters often. Use blankets that are made of polyester or cotton. General instructions Take over-the-counter and prescription medicines only as told by your doctor. Do not smoke or use any products that contain nicotine or tobacco. If you need help quitting,  ask your doctor. Stay away from secondhand smoke. Avoid doing things outdoors when allergen counts are high and when air quality is low. Warm up before you exercise. Take time to cool down after exercise. Use a peak flow meter as told by your doctor. A peak flow meter is a tool that measures how well your lungs are working. Keep track of the peak flow meter's readings. Write them down. Follow your asthma action plan. This is a written plan for taking care of your asthma and treating your attacks. Make sure you get all the shots (vaccines) that your doctor recommends. Ask your doctor about a flu shot and a pneumonia shot. Keep all follow-up visits. Contact a doctor if: You have wheezing, shortness of breath, or a cough even while taking medicine to prevent attacks. The mucus you cough up (sputum) is thicker than usual. The mucus you cough up changes from clear or white to yellow, green, gray, or is bloody. You have problems from the medicine  you are taking, such as: A rash. Itching. Swelling. Trouble breathing. You need reliever medicines more than 2-3 times a week. Your peak flow reading is still at 50-79% of your personal best after following the action plan for 1 hour. You have a fever. Get help right away if: You seem to be worse and are not responding to medicine during an asthma attack. You are short of breath even at rest. You get short of breath when doing very little activity. You have trouble eating, drinking, or talking. You have chest pain or tightness. You have a fast heartbeat. Your lips or fingernails start to turn blue. You are light-headed or dizzy, or you faint. Your peak flow is less than 50% of your personal best. You feel too tired to breathe normally. These symptoms may be an emergency. Get help right away. Call 911. Do not wait to see if the symptoms will go away. Do not drive yourself to the hospital. Summary Asthma is a long-term (chronic) condition in  which the airways get tight and narrow. An asthma attack can make it hard to breathe. Asthma cannot be cured, but medicines and lifestyle changes can help control it. Make sure you understand how to avoid triggers and how and when to use your medicines. Avoid things that can cause allergy symptoms (allergens). These include animal skin flakes (dander) and pollen from trees or grass. Avoid things that pollute the air. These may include household cleaners, wood smoke, smog, or chemical odors. This information is not intended to replace advice given to you by your health care provider. Make sure you discuss any questions you have with your health care provider. Document Revised: 08/06/2021 Document Reviewed: 08/06/2021 Elsevier Patient Education  2023 Elsevier Inc.   Allergies can cause a lot of symptoms: watery, itching eyes, runny nose (clear), sneezing, sinus pressure, and headaches. This is not making you contagious to others. You can not spread to others or catch this from others. These symptoms happen after you have been exposed to something that you are allergic to an allergen. Prevention: The best prevention is to avoid the things that you know you are allergic to, for example smoke (cigarette, cigar, wood); pollens and molds; animal dander; dust mites. And indoor inhalants such as cleaning products or aerosol sprays.  Target your bedroom as allergy free by removing carpets, damp mopping floors weekly, hanging washable curtains instead of blinds, removing books and stuffed animals, using foam pillows, and encasing pillows and mattress in plastic. Do not blow your nose too frequently or too hard. It may cause your eardrum to perforate (tear). Blow through both nostrils at the same time to equalize pressure.  Use tissue when you blow your nose. Dispose of them and then wash your hands. If no tissue is available, do the "elbow sneeze" into the bend of your arm (away from your open hands). Always  wash your hands.  If able use the [facility] in the house and car to reduce exposure to pollens. Use an air filtration system in your house or buy a small one for your bedroom. Dust your house often, using a cloth and cleaner or polish that keeps the dust from flying into the air. Allergy testing can be done if you have had allergies for a long time and are not doing well on current treatments. Take your medications as directed. If you find the medications are not working let your healthcare provider know. It might take more than one medication to control allergies,  especially, seasonal ones.      If you have been instructed to have an in-person evaluation today at a local Urgent Care facility, please use the link below. It will take you to a list of all of our available [facility], including address, phone number and hours of operation. Please do not delay care.  [facility]  If you or a family member do not have a primary care provider, use the link below to schedule a visit and establish care. When you choose a [facility] primary care physician or advanced practice provider, you gain a long-term partner in health. Find a Primary Care Provider  Learn more about Center's in-office and virtual care options: [facility] - Get Care Now

## 2023-04-23 ENCOUNTER — Other Ambulatory Visit: Payer: Self-pay | Admitting: Family Medicine

## 2023-06-22 ENCOUNTER — Other Ambulatory Visit: Payer: Self-pay | Admitting: Family Medicine

## 2023-07-01 ENCOUNTER — Ambulatory Visit (INDEPENDENT_AMBULATORY_CARE_PROVIDER_SITE_OTHER): Payer: 59 | Admitting: Family Medicine

## 2023-07-01 ENCOUNTER — Encounter: Payer: Self-pay | Admitting: Family Medicine

## 2023-07-01 VITALS — BP 128/78 | HR 93 | Ht 62.0 in | Wt 161.2 lb

## 2023-07-01 DIAGNOSIS — E0520 Thyrotoxicosis with toxic multinodular goiter without thyrotoxic crisis or storm: Secondary | ICD-10-CM | POA: Diagnosis not present

## 2023-07-01 DIAGNOSIS — E890 Postprocedural hypothyroidism: Secondary | ICD-10-CM | POA: Diagnosis not present

## 2023-07-01 DIAGNOSIS — I10 Essential (primary) hypertension: Secondary | ICD-10-CM | POA: Diagnosis not present

## 2023-07-01 DIAGNOSIS — E119 Type 2 diabetes mellitus without complications: Secondary | ICD-10-CM

## 2023-07-06 ENCOUNTER — Encounter: Payer: Self-pay | Admitting: Family Medicine

## 2023-07-06 NOTE — Progress Notes (Signed)
Erica Coleman - 42 y.o. female MRN [id number]  Date of birth: [birth year]  Subjective Chief Complaint  Patient presents with   Hypertension    HPI Erica Coleman is a 42 y.o. female here today for follow-up visit.  She reports that she is doing pretty well at this time.  Remains on amlodipine and hydrochlorothiazide for management of hypertension.  Blood pressure is well-controlled at this time.  She denies side effects from medication at current strength.  She has not had symptoms related to her hypertension including chest pain, shortness of breath, palpitations, headaches or vision changes.  Prior history of diabetes but has been well-controlled with dietary changes and weight loss.  Thyroid disease with history of goiter status post thyroidectomy, now with postsurgical hypothyroidism.  Feels good with current strength of levothyroxine.  SHe is on Adderall which is being managed by psychiatry.  Stable at this time.  ROS:  A comprehensive ROS was completed and negative except as noted per HPI   Allergies  Allergen Reactions   Sulfa Antibiotics Other (See Comments) and Anxiety    Other reaction(s): Agitation sweating    Past Medical History:  Diagnosis Date   Heart murmur    as a child   Hypertension    no meds  now   Pre-diabetes    diet controlled    Past Surgical History:  Procedure Laterality Date   THYROIDECTOMY N/A 03/30/2021   Procedure: TOTAL THYROIDECTOMY;  Surgeon: Darnell Level, MD;  Location: [facility];  Service: General;  Laterality: N/A;   WISDOM TOOTH EXTRACTION      Social History   Socioeconomic History   Marital status: Single    Spouse name: Not on file   Number of children: Not on file   Years of education: Not on file   Highest education level: Not on file  Occupational History   Not on file  Tobacco Use   Smoking status: Former    Current packs/day: 0.00    Average packs/day: 0.5 packs/day for 10.0 years (5.0 ttl pk-yrs)    Types:  Cigarettes    Start date: 11/12/2003    Quit date: 11/11/2013    Years since quitting: 9.6   Smokeless tobacco: Never  Vaping Use   Vaping status: Former   Start date: 12/16/2013   Quit date: 03/20/2017   Substances: Nicotine, Flavoring  Substance and Sexual Activity   Alcohol use: Yes    Comment: Socially   Drug use: Never   Sexual activity: Yes    Birth control/protection: Implant  Other Topics Concern   Not on file  Social History Narrative   Not on file   Social Determinants of Health   Financial Resource Strain: Not on file  Food Insecurity: Not on file  Transportation Needs: Not on file  Physical Activity: Not on file  Stress: Not on file  Social Connections: Not on file    Family History  Problem Relation Age of Onset   Cancer Mother    Hypertension Mother    Diabetes Paternal Grandmother    Early death Brother    Kidney disease Brother     Health Maintenance  Topic Date Due   OPHTHALMOLOGY EXAM  Never done   DTaP/Tdap/Td (5 - Tdap) 04/05/1993   Diabetic kidney evaluation - Urine ACR  Never done   COVID-19 Vaccine (2 - 2023-24 season) 07/12/2022   HEMOGLOBIN A1C  10/30/2022   Diabetic kidney evaluation - eGFR measurement  05/01/2023   INFLUENZA VACCINE  06/12/2023   Hepatitis C Screening  09/04/2023 (Originally 04/05/2000)   HIV Screening  09/04/2023 (Originally 04/05/1997)   FOOT EXAM  09/04/2023   PAP SMEAR-Modifier  12/31/2025   HPV VACCINES  Aged Out     ----------------------------------------------------------------------------------------------------------------------------------------------------------------------------------------------------------------- Physical Exam BP 128/78   Pulse 93   Ht 5\' 2"  (1.575 m)   Wt 161 lb 4 oz (73.1 kg)   SpO2 100%   BMI 29.49 kg/m   Physical Exam Constitutional:      Appearance: Normal appearance.  HENT:     Head: Normocephalic and atraumatic.  Eyes:     General: No scleral icterus. Cardiovascular:      Rate and Rhythm: Normal rate and regular rhythm.  Pulmonary:     Effort: Pulmonary effort is normal.     Breath sounds: Normal breath sounds.  Musculoskeletal:     Cervical back: Neck supple.  Neurological:     Mental Status: She is alert.  Psychiatric:        Mood and Affect: Mood normal.        Behavior: Behavior normal.     ------------------------------------------------------------------------------------------------------------------------------------------------------------------------------------------------------------------- Assessment and Plan  Postoperative hypothyroidism Feeling pretty good with current strength of levothyroxine.  Update TSH levels.  Essential hypertension Blood pressure remains well-controlled.  She will continue current medications for management of hypertension.  Update labs today.  Type 2 diabetes mellitus without complication (HCC) She will continue to control this through dietary changes.   No orders of the defined types were placed in this encounter.   Return in about 6 months (around 01/01/2024) for HTN.    This visit occurred during the SARS-CoV-2 public health emergency.  Safety protocols were in place, including screening questions prior to the visit, additional usage of staff PPE, and extensive cleaning of exam room while observing appropriate contact time as indicated for disinfecting solutions.

## 2023-07-06 NOTE — Assessment & Plan Note (Signed)
Feeling pretty good with current strength of levothyroxine.  Update TSH levels.

## 2023-07-06 NOTE — Assessment & Plan Note (Signed)
Blood pressure remains well-controlled.  She will continue current medications for management of hypertension.  Update labs today.

## 2023-07-06 NOTE — Assessment & Plan Note (Signed)
She will continue to control this through dietary changes.

## 2023-08-21 ENCOUNTER — Other Ambulatory Visit: Payer: Self-pay | Admitting: Family Medicine

## 2023-08-21 DIAGNOSIS — E890 Postprocedural hypothyroidism: Secondary | ICD-10-CM

## 2023-09-25 ENCOUNTER — Other Ambulatory Visit: Payer: Self-pay | Admitting: Family Medicine

## 2023-10-26 ENCOUNTER — Other Ambulatory Visit: Payer: Self-pay | Admitting: Family Medicine

## 2023-12-28 ENCOUNTER — Other Ambulatory Visit: Payer: Self-pay | Admitting: Family Medicine

## 2024-01-01 ENCOUNTER — Ambulatory Visit: Payer: 59 | Admitting: Family Medicine

## 2024-01-08 LAB — CBC WITH DIFFERENTIAL/PLATELET
Basophils Absolute: 0 10*3/uL (ref 0.0–0.2)
Basos: 0 %
EOS (ABSOLUTE): 0.1 10*3/uL (ref 0.0–0.4)
Eos: 1 %
Hematocrit: 42.5 % (ref 34.0–46.6)
Hemoglobin: 14.3 g/dL (ref 11.1–15.9)
Immature Grans (Abs): 0 10*3/uL (ref 0.0–0.1)
Immature Granulocytes: 0 %
Lymphocytes Absolute: 2.3 10*3/uL (ref 0.7–3.1)
Lymphs: 32 %
MCH: 29.7 pg (ref 26.6–33.0)
MCHC: 33.6 g/dL (ref 31.5–35.7)
MCV: 88 fL (ref 79–97)
Monocytes Absolute: 0.5 10*3/uL (ref 0.1–0.9)
Monocytes: 8 %
Neutrophils Absolute: 4.1 10*3/uL (ref 1.4–7.0)
Neutrophils: 59 %
Platelets: 345 10*3/uL (ref 150–450)
RBC: 4.81 x10E6/uL (ref 3.77–5.28)
RDW: 13.6 % (ref 11.7–15.4)
WBC: 7 10*3/uL (ref 3.4–10.8)

## 2024-01-08 LAB — CMP14+EGFR
ALT: 15 IU/L (ref 0–32)
AST: 14 IU/L (ref 0–40)
Albumin: 4.5 g/dL (ref 3.9–4.9)
Alkaline Phosphatase: 76 IU/L (ref 44–121)
BUN/Creatinine Ratio: 11 (ref 9–23)
BUN: 10 mg/dL (ref 6–24)
Bilirubin Total: 0.6 mg/dL (ref 0.0–1.2)
CO2: 22 mmol/L (ref 20–29)
Calcium: 9.3 mg/dL (ref 8.7–10.2)
Chloride: 101 mmol/L (ref 96–106)
Creatinine, Ser: 0.91 mg/dL (ref 0.57–1.00)
Globulin, Total: 2.7 g/dL (ref 1.5–4.5)
Glucose: 126 mg/dL — ABNORMAL HIGH (ref 70–99)
Potassium: 3.6 mmol/L (ref 3.5–5.2)
Sodium: 139 mmol/L (ref 134–144)
Total Protein: 7.2 g/dL (ref 6.0–8.5)
eGFR: 81 mL/min/{1.73_m2} (ref >59)

## 2024-01-08 LAB — MICROALBUMIN / CREATININE URINE RATIO
Creatinine, Urine: 143.6 mg/dL
Microalb/Creat Ratio: 15 mg/g{creat} (ref 0–29)
Microalbumin, Urine: 22 ug/mL

## 2024-01-08 LAB — LIPID PANEL WITH LDL/HDL RATIO
Cholesterol, Total: 198 mg/dL (ref 100–199)
HDL: 39 mg/dL — ABNORMAL LOW (ref >39)
LDL Chol Calc (NIH): 137 mg/dL — ABNORMAL HIGH (ref 0–99)
LDL/HDL Ratio: 3.5 ratio — ABNORMAL HIGH (ref 0.0–3.2)
Triglycerides: 121 mg/dL (ref 0–149)
VLDL Cholesterol Cal: 22 mg/dL (ref 5–40)

## 2024-01-08 LAB — TSH: TSH: 0.238 u[IU]/mL — ABNORMAL LOW (ref 0.450–4.500)

## 2024-01-13 ENCOUNTER — Encounter: Payer: Self-pay | Admitting: Family Medicine

## 2024-01-13 ENCOUNTER — Ambulatory Visit: Payer: 59 | Admitting: Family Medicine

## 2024-01-13 VITALS — BP 118/79 | HR 96 | Ht 62.0 in | Wt 159.0 lb

## 2024-01-13 DIAGNOSIS — E785 Hyperlipidemia, unspecified: Secondary | ICD-10-CM | POA: Insufficient documentation

## 2024-01-13 DIAGNOSIS — I10 Essential (primary) hypertension: Secondary | ICD-10-CM | POA: Diagnosis not present

## 2024-01-13 DIAGNOSIS — E890 Postprocedural hypothyroidism: Secondary | ICD-10-CM | POA: Diagnosis not present

## 2024-01-13 DIAGNOSIS — E782 Mixed hyperlipidemia: Secondary | ICD-10-CM

## 2024-01-13 DIAGNOSIS — E119 Type 2 diabetes mellitus without complications: Secondary | ICD-10-CM | POA: Diagnosis not present

## 2024-01-13 LAB — POCT GLYCOSYLATED HEMOGLOBIN (HGB A1C): HbA1c, POC (prediabetic range): 5.7 % (ref 5.7–6.4)

## 2024-01-13 NOTE — Assessment & Plan Note (Signed)
 Feeling pretty good with current strength of levothyroxine.   Lab Results  Component Value Date   TSH 0.238 (L) 01/07/2024

## 2024-01-13 NOTE — Assessment & Plan Note (Signed)
 A1c improved at 5.7%.  She will continue to control this through dietary changes.

## 2024-01-13 NOTE — Assessment & Plan Note (Signed)
 Elevated cholesterol noted on recent labs.  Continued dietary efforts encouraged.

## 2024-01-13 NOTE — Progress Notes (Signed)
 Erica Coleman - 43 y.o. female MRN [id number]  Date of birth: 27-Dec-1980  Subjective Chief Complaint  Patient presents with   Medical Management of Chronic Issues    HPI Erica Coleman is a 43 y.o. female here today for follow up visit.   She reports that she is doing well.  She had labs completed prior to visit today.   Cholesterol elevated with LDL of 137 and low HDL at 39.  She is not current ly on a statin.  She feels that diet is typically pretty good.    Her A1c today is 5.7%.  She is off medication for management of her blood sugars as she made improvements to her diet.   BP remains well controlled with amlodipine and hydrochlorothiazide.  Tolerating these at current strength.  She denies chest pain, shortness of breath, palpitations, headache or vision changes.    Recent TSH WNL.  Taking levothyroxine as directed.    ROS:  A comprehensive ROS was completed and negative except as noted per HPI  Allergies  Allergen Reactions   Sulfa Antibiotics Other (See Comments) and Anxiety    Other reaction(s): Agitation sweating   Kiwi Extract     Other Reaction(s): anaphylaxis / hives    Past Medical History:  Diagnosis Date   Heart murmur    as a child   Hypertension    no meds  now   Pre-diabetes    diet controlled    Past Surgical History:  Procedure Laterality Date   THYROIDECTOMY N/A 03/30/2021   Procedure: TOTAL THYROIDECTOMY;  Surgeon: Darnell Level, MD;  Location: [facility];  Service: General;  Laterality: N/A;   WISDOM TOOTH EXTRACTION      Social History   Socioeconomic History   Marital status: Single    Spouse name: Not on file   Number of children: Not on file   Years of education: Not on file   Highest education level: Not on file  Occupational History   Not on file  Tobacco Use   Smoking status: Former    Current packs/day: 0.00    Average packs/day: 0.5 packs/day for 10.0 years (5.0 ttl pk-yrs)    Types: Cigarettes    Start date: 11/12/2003     Quit date: 11/11/2013    Years since quitting: 10.1   Smokeless tobacco: Never  Vaping Use   Vaping status: Former   Start date: 12/16/2013   Quit date: 03/20/2017   Substances: Nicotine, Flavoring  Substance and Sexual Activity   Alcohol use: Yes    Comment: Socially   Drug use: Never   Sexual activity: Yes    Birth control/protection: Implant  Other Topics Concern   Not on file  Social History Narrative   Not on file   Social Drivers of Health   Financial Resource Strain: Not on file  Food Insecurity: Not on file  Transportation Needs: Not on file  Physical Activity: Not on file  Stress: Not on file  Social Connections: Not on file    Family History  Problem Relation Age of Onset   Cancer Mother    Hypertension Mother    Diabetes Paternal Grandmother    Early death Brother    Kidney disease Brother     Health Maintenance  Topic Date Due   Pneumococcal Vaccine 11-69 Years old (1 of 2 - PCV) Never done   OPHTHALMOLOGY EXAM  Never done   DTaP/Tdap/Td (5 - Tdap) 04/05/1993   HIV Screening  Never  done   Hepatitis C Screening  Never done   COVID-19 Vaccine (2 - 2024-25 season) 07/13/2023   FOOT EXAM  09/04/2023   INFLUENZA VACCINE  02/09/2024 (Originally 06/12/2023)   HEMOGLOBIN A1C  07/15/2024   Diabetic kidney evaluation - eGFR measurement  01/06/2025   Diabetic kidney evaluation - Urine ACR  01/06/2025   Cervical Cancer Screening (HPV/Pap Cotest)  01/01/2028   HPV VACCINES  Aged Out     ----------------------------------------------------------------------------------------------------------------------------------------------------------------------------------------------------------------- Physical Exam BP 118/79 (BP Location: Left Arm, Patient Position: Sitting, Cuff Size: Normal)   Pulse 96   Ht 5\' 2"  (1.575 m)   Wt 159 lb (72.1 kg)   SpO2 100%   BMI 29.08 kg/m   Physical Exam Constitutional:      Appearance: Normal appearance.  HENT:     Head:  Normocephalic and atraumatic.  Eyes:     General: No scleral icterus. Cardiovascular:     Rate and Rhythm: Normal rate and regular rhythm.  Pulmonary:     Effort: Pulmonary effort is normal.     Breath sounds: Normal breath sounds.  Neurological:     Mental Status: She is alert.  Psychiatric:        Mood and Affect: Mood normal.        Behavior: Behavior normal.     ------------------------------------------------------------------------------------------------------------------------------------------------------------------------------------------------------------------- Assessment and Plan  Type 2 diabetes mellitus without complication (HCC) A1c improved at 5.7%.  She will continue to control this through dietary changes.  Postoperative hypothyroidism Feeling pretty good with current strength of levothyroxine.   Lab Results  Component Value Date   TSH 0.238 (L) 01/07/2024     Essential hypertension Blood pressure remains well-controlled.  She will continue current medications for management of hypertension.   HLD (hyperlipidemia) Elevated cholesterol noted on recent labs.  Continued dietary efforts encouraged.    No orders of the defined types were placed in this encounter.   Return in about 6 months (around 07/15/2024) for Hypertension.    This visit occurred during the SARS-CoV-2 public health emergency.  Safety protocols were in place, including screening questions prior to the visit, additional usage of staff PPE, and extensive cleaning of exam room while observing appropriate contact time as indicated for disinfecting solutions.

## 2024-01-13 NOTE — Assessment & Plan Note (Signed)
Blood pressure remains well-controlled.  She will continue current medications for management of hypertension.

## 2024-01-27 ENCOUNTER — Other Ambulatory Visit: Payer: Self-pay | Admitting: Family Medicine

## 2024-02-26 ENCOUNTER — Other Ambulatory Visit: Payer: Self-pay | Admitting: Family Medicine

## 2024-02-26 DIAGNOSIS — E890 Postprocedural hypothyroidism: Secondary | ICD-10-CM

## 2024-03-31 ENCOUNTER — Other Ambulatory Visit: Payer: Self-pay | Admitting: Family Medicine

## 2024-07-15 ENCOUNTER — Ambulatory Visit: Admitting: Family Medicine

## 2024-07-26 ENCOUNTER — Encounter: Payer: Self-pay | Admitting: Family Medicine

## 2024-07-26 ENCOUNTER — Ambulatory Visit: Admitting: Family Medicine

## 2024-07-26 VITALS — BP 131/83 | HR 92 | Ht 62.0 in | Wt 159.0 lb

## 2024-07-26 DIAGNOSIS — Z23 Encounter for immunization: Secondary | ICD-10-CM | POA: Diagnosis not present

## 2024-07-26 DIAGNOSIS — I10 Essential (primary) hypertension: Secondary | ICD-10-CM | POA: Diagnosis not present

## 2024-07-26 DIAGNOSIS — Z1231 Encounter for screening mammogram for malignant neoplasm of breast: Secondary | ICD-10-CM

## 2024-07-26 DIAGNOSIS — E890 Postprocedural hypothyroidism: Secondary | ICD-10-CM | POA: Diagnosis not present

## 2024-07-26 DIAGNOSIS — E119 Type 2 diabetes mellitus without complications: Secondary | ICD-10-CM

## 2024-07-26 LAB — POCT GLYCOSYLATED HEMOGLOBIN (HGB A1C): HbA1c, POC (controlled diabetic range): 6.1 % (ref 0.0–7.0)

## 2024-07-26 NOTE — Progress Notes (Signed)
 Erica Coleman - 43 y.o. female MRN [id number]  Date of birth: 12-Mar-1981  Subjective Chief Complaint  Patient presents with   Hypertension    HPI Erica Coleman is a 43 y.o. female here today for follow up visit.    She reports that she is doing well.   She remains on hydrochlorothiazide  for management of htn.  She has been off of amlodipine  for about 1 week and BP is well controlled.  She denies side effects from medication at current strength.  She has not had chest pain, shortness of breath, palpitations, headache or vision changes.    History of T2DM.  She has been managing with diet and lifestyle changes.  Her A1c today is 6.1%  ROS:  A comprehensive ROS was completed and negative except as noted per HPI  Allergies  Allergen Reactions   Sulfa Antibiotics Other (See Comments) and Anxiety    Other reaction(s): Agitation sweating   Kiwi Extract     Other Reaction(s): anaphylaxis / hives    Past Medical History:  Diagnosis Date   Heart murmur    as a child   Hypertension    no meds  now   Pre-diabetes    diet controlled    Past Surgical History:  Procedure Laterality Date   THYROIDECTOMY N/A 03/30/2021   Procedure: TOTAL THYROIDECTOMY;  Surgeon: Eletha Boas, MD;  Location: [facility];  Service: General;  Laterality: N/A;   WISDOM TOOTH EXTRACTION      Social History   Socioeconomic History   Marital status: Single    Spouse name: Not on file   Number of children: Not on file   Years of education: Not on file   Highest education level: Not on file  Occupational History   Not on file  Tobacco Use   Smoking status: Former    Current packs/day: 0.00    Average packs/day: 0.5 packs/day for 10.0 years (5.0 ttl pk-yrs)    Types: Cigarettes    Start date: 11/12/2003    Quit date: 11/11/2013    Years since quitting: 10.7   Smokeless tobacco: Never  Vaping Use   Vaping status: Former   Start date: 12/16/2013   Quit date: 03/20/2017   Substances: Nicotine,  Flavoring  Substance and Sexual Activity   Alcohol use: Yes    Comment: Socially   Drug use: Never   Sexual activity: Yes    Birth control/protection: Implant  Other Topics Concern   Not on file  Social History Narrative   Not on file   Social Drivers of Health   Financial Resource Strain: Not on file  Food Insecurity: Not on file  Transportation Needs: Not on file  Physical Activity: Not on file  Stress: Not on file  Social Connections: Not on file    Family History  Problem Relation Age of Onset   Cancer Mother    Hypertension Mother    Diabetes Paternal Grandmother    Early death Brother    Kidney disease Brother     Health Maintenance  Topic Date Due   OPHTHALMOLOGY EXAM  Never done   HIV Screening  Never done   Hepatitis C Screening  Never done   Mammogram  Never done   Pneumococcal Vaccine (1 of 2 - PCV) 07/26/2025 (Originally 04/05/2001)   Hepatitis B Vaccines 19-59 Average Risk (1 of 3 - 19+ 3-dose series) 07/26/2025 (Originally 04/05/2001)   HPV VACCINES (1 - Risk 3-dose SCDM series) 07/26/2025 (Originally 04/05/2009)  COVID-19 Vaccine (2 - 2025-26 season) 08/11/2025 (Originally 07/12/2024)   Diabetic kidney evaluation - eGFR measurement  01/06/2025   Diabetic kidney evaluation - Urine ACR  01/06/2025   HEMOGLOBIN A1C  01/23/2025   FOOT EXAM  07/26/2025   Cervical Cancer Screening (HPV/Pap Cotest)  01/01/2028   DTaP/Tdap/Td (6 - Td or Tdap) 07/26/2034   Influenza Vaccine  Completed   Meningococcal B Vaccine  Aged Out     ----------------------------------------------------------------------------------------------------------------------------------------------------------------------------------------------------------------- Physical Exam BP 131/83 (BP Location: Left Arm, Patient Position: Sitting, Cuff Size: Normal)   Pulse 92   Ht 5' 2 (1.575 m)   Wt 159 lb (72.1 kg)   SpO2 99%   BMI 29.08 kg/m   Physical Exam Constitutional:      Appearance:  Normal appearance.  Eyes:     General: No scleral icterus. Cardiovascular:     Rate and Rhythm: Normal rate and regular rhythm.  Pulmonary:     Effort: Pulmonary effort is normal.     Breath sounds: Normal breath sounds.  Neurological:     Mental Status: She is alert.  Psychiatric:        Mood and Affect: Mood normal.        Behavior: Behavior normal.     ------------------------------------------------------------------------------------------------------------------------------------------------------------------------------------------------------------------- Assessment and Plan  Type 2 diabetes mellitus without complication (HCC) A1c is increased slightly from last time.  She will work on dietary changes and exercise habits.    Postoperative hypothyroidism Feeling pretty good with current strength of levothyroxine .   Lab Results  Component Value Date   TSH 0.238 (L) 01/07/2024     Essential hypertension Blood pressure remains well-controlled.  She will continue hydrochlorothiazide  for BP management.    No orders of the defined types were placed in this encounter.   Return in about 6 months (around 01/23/2025) for Annual Exam, Type 2 Diabetes.

## 2024-07-26 NOTE — Assessment & Plan Note (Signed)
 Blood pressure remains well-controlled.  She will continue hydrochlorothiazide  for BP management.

## 2024-07-26 NOTE — Assessment & Plan Note (Signed)
 Feeling pretty good with current strength of levothyroxine.   Lab Results  Component Value Date   TSH 0.238 (L) 01/07/2024

## 2024-07-26 NOTE — Assessment & Plan Note (Signed)
 A1c is increased slightly from last time.  She will work on dietary changes and exercise habits.

## 2024-08-01 ENCOUNTER — Other Ambulatory Visit: Payer: Self-pay | Admitting: Family Medicine

## 2024-08-31 ENCOUNTER — Other Ambulatory Visit: Payer: Self-pay | Admitting: Family Medicine

## 2024-08-31 DIAGNOSIS — E890 Postprocedural hypothyroidism: Secondary | ICD-10-CM

## 2024-09-21 ENCOUNTER — Telehealth: Payer: Self-pay

## 2024-09-21 NOTE — Telephone Encounter (Signed)
 [facility] is due for a mammogram unless done at a different location.   Left message for a return call.

## 2024-09-23 NOTE — Telephone Encounter (Signed)
 Spoke with patient. She has received a call from the radiology department to schedule this awhile ago but forgot to return their call. She thanks you for the reminder call - she will call them to schedule this soon.

## 2024-10-14 ENCOUNTER — Ambulatory Visit

## 2024-10-14 DIAGNOSIS — Z1231 Encounter for screening mammogram for malignant neoplasm of breast: Secondary | ICD-10-CM

## 2025-01-24 ENCOUNTER — Encounter: Admitting: Family Medicine
